# Patient Record
Sex: Female | Born: 1990
Health system: Southern US, Community
[De-identification: ages and names within clinical notes are randomized; demographics above are authoritative.]

## PROBLEM LIST (undated history)

## (undated) DIAGNOSIS — F32A Depression, unspecified: Secondary | ICD-10-CM

## (undated) HISTORY — PX: TUBAL LIGATION: SHX77

---

## 2020-06-17 ENCOUNTER — Ambulatory Visit: Admit: 2020-06-17 | Discharge status: Absent without leave | Payer: Self-pay

## 2020-06-17 ENCOUNTER — Ambulatory Visit
Admission: EM | Admit: 2020-06-17 | Discharge: 2020-06-17 | Disposition: A | Discharge status: Home / Self Care | Payer: Self-pay | Attending: Emergency Medicine | Admitting: Emergency Medicine

## 2020-06-17 DIAGNOSIS — R030 Elevated blood-pressure reading, without diagnosis of hypertension: Secondary | ICD-10-CM

## 2020-06-17 DIAGNOSIS — F439 Reaction to severe stress, unspecified: Secondary | ICD-10-CM

## 2020-06-17 DIAGNOSIS — Z566 Other physical and mental strain related to work: Secondary | ICD-10-CM

## 2020-06-17 MED ORDER — HYDROXYZINE HCL 25 MG PO TABS: 25.0000 mg | ORAL_TABLET | Freq: Four times a day (QID) | ORAL | 0 refills | Status: AC

## 2020-06-17 NOTE — Discharge Instructions (Addendum)
Practice stress management techniques as discussed. Important to schedule follow-up with primary care for further evaluation and management of blood pressure. In the interim, please go to the ER if you have worsening chest pain, difficulty breathing, lower leg swelling, weakness, nausea or vomiting, lightheadedness, dizziness, or you pass out.

## 2020-06-17 NOTE — ED Triage Notes (Signed)
Pt c/o high blood pressure for past 2 day with headaches yesterday. States while in waiting room she developed dizziness and center chest pressure. Pt states has been under a lot of stress recently. No hx of HTN.

## 2020-06-17 NOTE — ED Provider Notes (Signed)
[facility]    CSN: [id number] Arrival date & time: 06/17/20  0917      History   Chief Complaint Chief Complaint  Patient presents with  . Hypertension    HPI Jamie Moses is a 29 y.o. female presenting for evaluation of elevated blood pressure readings. 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 Has been having headaches and feeling significantly stressed over the last few days. Blood pressure readings were elevated with systolics of 150s, diastolic 100s. Does endorse some chest tightness, that is alleviated when she is home. Patient is also a single mother to 2 kids and attending school. States she "needs to prioritize things ". Denies SI/HI. Has never been on SSRI or blood pressure medications. No history of DVT, PE, pregnancy (s/p BTL). No weakness, numbness, nausea, vomiting, palpitations, shortness of breath.    History reviewed. No pertinent past medical history.  There are no problems to display for this patient.   Past Surgical History:  Procedure Laterality Date  . TUBAL LIGATION      OB History   No obstetric history on file.      Home Medications    Prior to Admission medications   Medication Sig Start Date End Date Taking? Authorizing Provider  hydrOXYzine (ATARAX/VISTARIL) 25 MG tablet Take 1 tablet (25 mg total) by mouth every 6 (six) hours. 06/17/20   Hall-Potvin, [place], PA-C    Family History History reviewed. No pertinent family history.  Social History Social History   Tobacco Use  . Smoking status: Never Smoker  . Smokeless tobacco: Never Used  Substance Use Topics  . Alcohol use: Not Currently  . Drug use: Not Currently     Allergies   Patient has no known allergies.   Review of Systems As per HPI   Physical Exam Triage Vital Signs ED Triage Vitals  Enc Vitals Group     BP 06/17/20 1013 (!) 153/98     Pulse Rate 06/17/20 1013 97     Resp 06/17/20 1013 18     Temp 06/17/20 1013 98 F (36.7 C)      Temp Source 06/17/20 1013 Oral     SpO2 06/17/20 1013 98 %     Weight --      Height --      Head Circumference --      Peak Flow --      Pain Score 06/17/20 1014 8     Pain Loc --      Pain Edu? --      Excl. in GC? --    No data found.  Updated Vital Signs BP (!) 142/87 (BP Location: Left Arm)   Pulse 97   Temp 98 F (36.7 C) (Oral)   Resp 18   LMP 06/03/2020   SpO2 98%   Visual Acuity Right Eye Distance:   Left Eye Distance:   Bilateral Distance:    Right Eye Near:   Left Eye Near:    Bilateral Near:     Physical Exam Vitals reviewed.  Constitutional:      General: She is not in acute distress.    Appearance: She is normal weight. She is not ill-appearing.  HENT:     Head: Normocephalic and atraumatic.  Eyes:     General: No scleral icterus.       Right eye: No discharge.        Left eye: No discharge.     Extraocular Movements: Extraocular movements intact.  Pupils: Pupils are equal, round, and reactive to light.  Cardiovascular:     Rate and Rhythm: Normal rate and regular rhythm.     Pulses: Normal pulses.     Heart sounds: No murmur heard.   Pulmonary:     Effort: Pulmonary effort is normal. No respiratory distress.     Breath sounds: No wheezing.  Chest:     Chest wall: No tenderness.  Abdominal:     General: Abdomen is flat.     Palpations: Abdomen is soft.     Tenderness: There is no abdominal tenderness. There is no guarding.  Musculoskeletal:     Cervical back: Normal range of motion and neck supple. No muscular tenderness.  Lymphadenopathy:     Cervical: No cervical adenopathy.  Skin:    General: Skin is warm.     Capillary Refill: Capillary refill takes less than 2 seconds.     Coloration: Skin is not jaundiced or pale.     Findings: No rash.  Neurological:     General: No focal deficit present.     Mental Status: She is alert and oriented to person, place, and time.  Psychiatric:        Mood and Affect: Mood normal.         Thought Content: Thought content normal.      UC Treatments / Results  Labs (all labs ordered are listed, but only abnormal results are displayed) Labs Reviewed - No data to display  EKG   Radiology No results found.  Procedures Procedures (including critical care time)  Medications Ordered in UC Medications - No data to display  Initial Impression / Assessment and Plan / UC Course  I have reviewed the triage vital signs and the nursing notes.  Pertinent labs & imaging results that were available during my care of the patient were reviewed by me and considered in my medical decision making (see chart for details).     Patient appears well today in office. With rest/relaxation blood pressure trending towards normal.  EKG done in office, reviewed by me without previous to compare: NSR with ventricular 86 bpm.  No QTC prolongation.  No ST elevation or depression.  Nonacute EKG.Unable to calculate heart score as there is no ability to draw troponin in UC setting.  Patient is low risk based on age, comorbidities. Offered trialing hydroxyzine for anxiety, amlodipine for blood pressure. Patient electing to trial hydroxyzine, wants to defer amlodipine at this time and lieu of stress management/mindfulness. Will reevaluate at PCP appointment.  ER return precautions discussed, pt verbalized understanding and is agreeable to plan. Final Clinical Impressions(s) / UC Diagnoses   Final diagnoses:  Elevated blood pressure reading in office without diagnosis of hypertension  Stress at home  Stress at work     Discharge Instructions     Practice stress management techniques as discussed. Important to schedule follow-up with primary care for further evaluation and management of blood pressure. In the interim, please go to the ER if you have worsening chest pain, difficulty breathing, lower leg swelling, weakness, nausea or vomiting, lightheadedness, dizziness, or you pass out.    ED  Prescriptions    Medication Sig Dispense Auth. Provider   hydrOXYzine (ATARAX/VISTARIL) 25 MG tablet Take 1 tablet (25 mg total) by mouth every 6 (six) hours. 12 tablet Hall-Potvin, [place], PA-C     PDMP not reviewed this encounter.   Hall-Potvin, [place], [place] 06/17/20 1127

## 2020-12-17 DIAGNOSIS — Z1151 Encounter for screening for human papillomavirus (HPV): Secondary | ICD-10-CM | POA: Diagnosis not present

## 2020-12-17 DIAGNOSIS — Z01419 Encounter for gynecological examination (general) (routine) without abnormal findings: Secondary | ICD-10-CM | POA: Diagnosis not present

## 2020-12-17 DIAGNOSIS — Z202 Contact with and (suspected) exposure to infections with a predominantly sexual mode of transmission: Secondary | ICD-10-CM | POA: Diagnosis not present

## 2020-12-17 DIAGNOSIS — F419 Anxiety disorder, unspecified: Secondary | ICD-10-CM | POA: Diagnosis not present

## 2020-12-17 DIAGNOSIS — Z124 Encounter for screening for malignant neoplasm of cervix: Secondary | ICD-10-CM | POA: Diagnosis not present

## 2020-12-17 DIAGNOSIS — R5383 Other fatigue: Secondary | ICD-10-CM | POA: Diagnosis not present

## 2020-12-17 DIAGNOSIS — Z130 Encounter for screening for diseases of the blood and blood-forming organs and certain disorders involving the immune mechanism: Secondary | ICD-10-CM | POA: Diagnosis not present

## 2021-02-16 DIAGNOSIS — F419 Anxiety disorder, unspecified: Secondary | ICD-10-CM | POA: Diagnosis not present

## 2021-02-16 DIAGNOSIS — F320 Major depressive disorder, single episode, mild: Secondary | ICD-10-CM | POA: Diagnosis not present

## 2021-02-16 DIAGNOSIS — I10 Essential (primary) hypertension: Secondary | ICD-10-CM | POA: Diagnosis not present

## 2021-03-19 DIAGNOSIS — F419 Anxiety disorder, unspecified: Secondary | ICD-10-CM | POA: Diagnosis not present

## 2021-03-19 DIAGNOSIS — I10 Essential (primary) hypertension: Secondary | ICD-10-CM | POA: Diagnosis not present

## 2021-03-19 DIAGNOSIS — F320 Major depressive disorder, single episode, mild: Secondary | ICD-10-CM | POA: Diagnosis not present

## 2021-04-30 DIAGNOSIS — F320 Major depressive disorder, single episode, mild: Secondary | ICD-10-CM | POA: Diagnosis not present

## 2021-04-30 DIAGNOSIS — I10 Essential (primary) hypertension: Secondary | ICD-10-CM | POA: Diagnosis not present

## 2021-04-30 DIAGNOSIS — F419 Anxiety disorder, unspecified: Secondary | ICD-10-CM | POA: Diagnosis not present

## 2021-05-13 ENCOUNTER — Other Ambulatory Visit (HOSPITAL_BASED_OUTPATIENT_CLINIC_OR_DEPARTMENT_OTHER): Payer: Self-pay | Admitting: Physician Assistant

## 2021-05-13 ENCOUNTER — Ambulatory Visit (HOSPITAL_BASED_OUTPATIENT_CLINIC_OR_DEPARTMENT_OTHER): Payer: BC Managed Care – PPO

## 2021-05-13 DIAGNOSIS — R1011 Right upper quadrant pain: Secondary | ICD-10-CM

## 2021-05-13 DIAGNOSIS — R198 Other specified symptoms and signs involving the digestive system and abdomen: Secondary | ICD-10-CM | POA: Diagnosis not present

## 2021-05-18 ENCOUNTER — Other Ambulatory Visit (HOSPITAL_COMMUNITY): Payer: Self-pay | Admitting: Physician Assistant

## 2021-05-18 ENCOUNTER — Other Ambulatory Visit: Payer: Self-pay | Admitting: Physician Assistant

## 2021-05-18 DIAGNOSIS — Z800 Family history of malignant neoplasm of digestive organs: Secondary | ICD-10-CM | POA: Diagnosis not present

## 2021-05-18 DIAGNOSIS — R112 Nausea with vomiting, unspecified: Secondary | ICD-10-CM

## 2021-05-18 DIAGNOSIS — R1013 Epigastric pain: Secondary | ICD-10-CM | POA: Diagnosis not present

## 2021-05-18 DIAGNOSIS — R1011 Right upper quadrant pain: Secondary | ICD-10-CM

## 2021-05-27 ENCOUNTER — Other Ambulatory Visit: Payer: Self-pay

## 2021-05-27 ENCOUNTER — Ambulatory Visit (HOSPITAL_COMMUNITY)
Admission: RE | Admit: 2021-05-27 | Discharge: 2021-05-27 | Disposition: A | Discharge status: Home / Self Care | Payer: BC Managed Care – PPO | Source: Ambulatory Visit | Attending: Physician Assistant | Admitting: Physician Assistant

## 2021-05-27 DIAGNOSIS — R112 Nausea with vomiting, unspecified: Secondary | ICD-10-CM | POA: Insufficient documentation

## 2021-05-27 DIAGNOSIS — R1011 Right upper quadrant pain: Secondary | ICD-10-CM | POA: Insufficient documentation

## 2021-05-27 MED ORDER — TECHNETIUM TC 99M MEBROFENIN IV KIT
5.0000 | PACK | Freq: Once | INTRAVENOUS | Status: AC | PRN
  Administered 2021-05-27: 5 via INTRAVENOUS

## 2021-06-02 DIAGNOSIS — R1011 Right upper quadrant pain: Secondary | ICD-10-CM | POA: Diagnosis not present

## 2021-06-07 ENCOUNTER — Other Ambulatory Visit: Payer: Self-pay

## 2021-06-07 ENCOUNTER — Emergency Department (HOSPITAL_BASED_OUTPATIENT_CLINIC_OR_DEPARTMENT_OTHER): Payer: BC Managed Care – PPO

## 2021-06-07 ENCOUNTER — Emergency Department (HOSPITAL_BASED_OUTPATIENT_CLINIC_OR_DEPARTMENT_OTHER)
Admission: EM | Admit: 2021-06-07 | Discharge: 2021-06-07 | Disposition: A | Discharge status: Home / Self Care | Payer: BC Managed Care – PPO | Attending: Emergency Medicine | Admitting: Emergency Medicine

## 2021-06-07 ENCOUNTER — Encounter (HOSPITAL_BASED_OUTPATIENT_CLINIC_OR_DEPARTMENT_OTHER): Payer: Self-pay

## 2021-06-07 DIAGNOSIS — R110 Nausea: Secondary | ICD-10-CM | POA: Diagnosis not present

## 2021-06-07 DIAGNOSIS — R1011 Right upper quadrant pain: Secondary | ICD-10-CM | POA: Diagnosis not present

## 2021-06-07 DIAGNOSIS — R1111 Vomiting without nausea: Secondary | ICD-10-CM | POA: Diagnosis not present

## 2021-06-07 DIAGNOSIS — N200 Calculus of kidney: Secondary | ICD-10-CM | POA: Diagnosis not present

## 2021-06-07 HISTORY — DX: Depression, unspecified: F32.A

## 2021-06-07 LAB — COMPREHENSIVE METABOLIC PANEL
ALT: 20 U/L (ref 0–44)
AST: 22 U/L (ref 15–41)
Albumin: 4.1 g/dL (ref 3.5–5.0)
Alkaline Phosphatase: 60 U/L (ref 38–126)
Anion gap: 8 (ref 5–15)
BUN: 7 mg/dL (ref 6–20)
CO2: 25 mmol/L (ref 22–32)
Calcium: 8.8 mg/dL — ABNORMAL LOW (ref 8.9–10.3)
Chloride: 101 mmol/L (ref 98–111)
Creatinine, Ser: 0.65 mg/dL (ref 0.44–1.00)
GFR, Estimated: >60 mL/min (ref >60)
Glucose, Bld: 94 mg/dL (ref 70–99)
Potassium: 3.8 mmol/L (ref 3.5–5.1)
Sodium: 134 mmol/L — ABNORMAL LOW (ref 135–145)
Total Bilirubin: 0.1 mg/dL — ABNORMAL LOW (ref 0.3–1.2)
Total Protein: 7.8 g/dL (ref 6.5–8.1)

## 2021-06-07 LAB — URINALYSIS, ROUTINE W REFLEX MICROSCOPIC
Bilirubin Urine: NEGATIVE
Glucose, UA: NEGATIVE mg/dL
Hgb urine dipstick: NEGATIVE
Ketones, ur: NEGATIVE mg/dL
Leukocytes,Ua: NEGATIVE
Nitrite: NEGATIVE
Protein, ur: NEGATIVE mg/dL
Specific Gravity, Urine: 1.02 (ref 1.005–1.030)
pH: 7 (ref 5.0–8.0)

## 2021-06-07 LAB — CBC
HCT: 34.9 % — ABNORMAL LOW (ref 36.0–46.0)
Hemoglobin: 11.1 g/dL — ABNORMAL LOW (ref 12.0–15.0)
MCH: 26.1 pg (ref 26.0–34.0)
MCHC: 31.8 g/dL (ref 30.0–36.0)
MCV: 82.1 fL (ref 80.0–100.0)
Platelets: 298 10*3/uL (ref 150–400)
RBC: 4.25 MIL/uL (ref 3.87–5.11)
RDW: 13.1 % (ref 11.5–15.5)
WBC: 6.8 10*3/uL (ref 4.0–10.5)
nRBC: 0 % (ref 0.0–0.2)

## 2021-06-07 LAB — LIPASE, BLOOD: Lipase: 21 U/L (ref 11–51)

## 2021-06-07 LAB — PREGNANCY, URINE: Preg Test, Ur: NEGATIVE

## 2021-06-07 MED ORDER — OXYCODONE-ACETAMINOPHEN 5-325 MG PO TABS
1.0000 | ORAL_TABLET | Freq: Once | ORAL | Status: AC
  Administered 2021-06-07: 1 via ORAL
  Filled 2021-06-07: qty 1

## 2021-06-07 NOTE — ED Provider Notes (Signed)
[facility] EMERGENCY DEPARTMENT Provider Note   CSN: [id number] Arrival date & time: 06/07/21  1711     History Chief Complaint  Patient presents with   Abdominal Pain    Jamie Moses is a 30 y.o. female.  30 y.o female with a PMH of recurrent RUQ pain presents to the ED with a chief complaint of right upper quadrant pain for the past month.  Reports she was previously seen by PCP, also seen by GI who wanted to assess for removal of her gallbladder along with worsening pain to the right upper quadrant.  She reports she has not had a CT performed yet.  Does endorse some constant right upper quadrant pain that has been worsening.  She has been taking Tylenol for pain without improvement in symptoms.  In addition, she tried Zofran but it did not help with her nausea.  She was referred to [facility] in the past in order to have her gallbladder removed, however no appointment has been established.  She was also told she would need a CT of her abdomen in order to rule out any colon cancer, as her mother has a history of this.  She denies any fever, vomiting, chest pain or shortness of breath.     Abdominal Pain Pain location:  RUQ Pain radiates to:  Does not radiate Pain severity:  Severe Onset quality:  Gradual Duration:  4 weeks Timing:  Constant Progression:  Worsening Chronicity:  Recurrent Relieved by:  Nothing Worsened by:  Nothing Ineffective treatments:  Acetaminophen and OTC medications Associated symptoms: nausea   Associated symptoms: no chest pain, no constipation, no diarrhea, no fever, no shortness of breath and no vomiting   Risk factors: obesity   Risk factors: no alcohol abuse, no aspirin use and has not had multiple surgeries       Past Medical History:  Diagnosis Date   Depression     There are no problems to display for this patient.   Past Surgical History:  Procedure Laterality Date   TUBAL LIGATION       OB History   No obstetric  history on file.     No family history on file.  Social History   Tobacco Use   Smoking status: Never   Smokeless tobacco: Never  Substance Use Topics   Alcohol use: Not Currently   Drug use: Not Currently    Home Medications Prior to Admission medications   Medication Sig Start Date End Date Taking? Authorizing Provider  hydrochlorothiazide (HYDRODIURIL) 12.5 MG tablet Take 12.5 mg by mouth every morning. 04/23/21   [provider]  hydrOXYzine (ATARAX/VISTARIL) 25 MG tablet Take 1 tablet (25 mg total) by mouth every 6 (six) hours. 06/17/20   Hall-Potvin, [place], PA-C  ondansetron (ZOFRAN-ODT) 4 MG disintegrating tablet Take 4 mg by mouth daily as needed. 05/14/21   [provider]  sertraline (ZOLOFT) 50 MG tablet Take 50 mg by mouth daily. 04/28/21   [provider]    Allergies    Patient has no known allergies.  Review of Systems   Review of Systems  Constitutional:  Negative for fever.  Respiratory:  Negative for shortness of breath.   Cardiovascular:  Negative for chest pain.  Gastrointestinal:  Positive for abdominal pain and nausea. Negative for constipation, diarrhea and vomiting.  Genitourinary:  Negative for flank pain.  All other systems reviewed and are negative.  Physical Exam Updated Vital Signs BP 122/74   Pulse 68  Temp 98.7 F (37.1 C) (Oral)   Resp 19   Ht 5' (1.524 m)   Wt 99.8 kg   LMP 05/27/2021   SpO2 100%   BMI 42.97 kg/m   Physical Exam Vitals and nursing note reviewed.  Constitutional:      General: She is not in acute distress.    Appearance: She is well-developed.     Comments: Appears uncomfortable.  HENT:     Head: Normocephalic and atraumatic.  Eyes:     Conjunctiva/sclera: Conjunctivae normal.  Cardiovascular:     Rate and Rhythm: Normal rate and regular rhythm.     Heart sounds: No murmur heard. Pulmonary:     Effort: Pulmonary effort is normal. No respiratory distress.     Breath sounds:  Normal breath sounds.  Abdominal:     Palpations: Abdomen is soft.     Tenderness: There is abdominal tenderness in the right upper quadrant. There is right CVA tenderness. Positive signs include Murphy's sign.     Comments: Significant pain along the right upper quadrant with radiation to her right shoulder.  Murphy sign present on my exam.  Bowel sounds are present throughout.  Musculoskeletal:     Cervical back: Neck supple.  Skin:    General: Skin is warm and dry.  Neurological:     Mental Status: She is alert.    ED Results / Procedures / Treatments   Labs (all labs ordered are listed, but only abnormal results are displayed) Labs Reviewed  COMPREHENSIVE METABOLIC PANEL - Abnormal; Notable for the following components:      Result Value   Sodium 134 (*)    Calcium 8.8 (*)    Total Bilirubin 0.1 (*)    All other components within normal limits  CBC - Abnormal; Notable for the following components:   Hemoglobin 11.1 (*)    HCT 34.9 (*)    All other components within normal limits  URINALYSIS, ROUTINE W REFLEX MICROSCOPIC - Abnormal; Notable for the following components:   APPearance HAZY (*)    All other components within normal limits  LIPASE, BLOOD  PREGNANCY, URINE    EKG None  Radiology CT ABDOMEN PELVIS WO CONTRAST  Result Date: 06/07/2021 CLINICAL DATA:  Nausea, vomiting, epigastric pain, abdominal pain, acute nonlocalized. EXAM: CT ABDOMEN AND PELVIS WITHOUT CONTRAST TECHNIQUE: Multidetector CT imaging of the abdomen and pelvis was performed following the standard protocol without IV contrast. COMPARISON:  None. FINDINGS: Lower chest: Calcified granuloma in the left lung base. No focal infiltration or consolidation. Hepatobiliary: No focal liver abnormality is seen. No gallstones, gallbladder wall thickening, or biliary dilatation. Pancreas: Unremarkable. No pancreatic ductal dilatation or surrounding inflammatory changes. Spleen: Normal in size without focal  abnormality. Adrenals/Urinary Tract: Single bilateral intrarenal stones, largest on the right measures 4 mm in diameter. No hydronephrosis or hydroureter and no ureteral stones are identified. Bladder is unremarkable. Stomach/Bowel: Stomach is within normal limits. Appendix appears normal. No evidence of bowel wall thickening, distention, or inflammatory changes. Vascular/Lymphatic: No significant vascular findings are present. No enlarged abdominal or pelvic lymph nodes. Reproductive: Uterus and bilateral adnexa are unremarkable. Other: No abdominal wall hernia or abnormality. No abdominopelvic ascites. Musculoskeletal: No acute or significant osseous findings. IMPRESSION: 1. Bilateral intrarenal nonobstructing stones. No ureteral stone or obstruction identified. 2. No evidence of bowel obstruction or inflammation. Appendix is normal. Electronically Signed   By: Burman Nieves M.D.   On: 06/07/2021 20:56    Procedures Procedures   Medications Ordered in  ED Medications  oxyCODONE-acetaminophen (PERCOCET/ROXICET) 5-325 MG per tablet 1 tablet (1 tablet Oral Given 06/07/21 2004)    ED Course  I have reviewed the triage vital signs and the nursing notes.  Pertinent labs & imaging results that were available during my care of the patient were reviewed by me and considered in my medical decision making (see chart for details).  Clinical Course as of 06/07/21 2244  Mon Jun 07, 2021  1946 Appearance(!): HAZY [JS]    Clinical Course User Index [JS] Claude Manges, [place]   MDM Rules/Calculators/A&P  Patient with a past medical history of recurrent right upper quadrant pain presents to the ED with worsening right upper quadrant pain that has been ongoing for the past month.  Previously evaluated by GI, who wanted a CT in order to assess for her gallbladder along with screening of colon cancer as her mom is a current survivor.  She has tried some Tylenol, Zofran without much improvement in  symptoms.  During arrival in the ED labs were obtained in triage, vital signs are within normal limits, she is afebrile nontoxic-appearing.  Abdomen is soft, tender to palpation along the right upper quadrant with radiation to her right shoulder, Murphy sign present on my exam.  Some right CVA tenderness but denies urinary symptoms on today's visit.  No chest pain, no shortness of breath and lungs are clear to auscultation without any swelling to her legs.  We discussed the results of her blood work on today's visit.  CMP with a slight decrease in her sodium.  Creatinine levels within normal limits.  LFTs are unremarkable, she does have a recurrent history of right upper quadrant pain, likely needs her gallbladder removed, however LFTs are unremarkable today and she is afebrile and tolerating p.o.  CBC with no leukocytosis.  Lipase levels normal.  Pregnancy test is also negative.  UA without any signs of infection.  She is requesting pain medication which I will provide for her along with CT abdomen and pelvis which was requested by gastroenterologist specialist.`  Provided with oxycodone during her ED visit, she refused any antiemetic at this time. CT Abdomen and pelvis showed: 1. Bilateral intrarenal nonobstructing stones. No ureteral stone or  obstruction identified.  2. No evidence of bowel obstruction or inflammation. Appendix is  normal.      She was provided with a copy of the CT result, I did discuss with her the importance of following up with [facility] in order to have her gallbladder removed to further avoid cholecystitis.  She is agreeable of treatment and plan at this time, return precautions discussed at length.  Patient stable for discharge.    Portions of this note were generated with Scientist, clinical (histocompatibility and immunogenetics). Dictation errors may occur despite best attempts at proofreading.  Final Clinical Impression(s) / ED Diagnoses Final diagnoses:  Right upper quadrant  abdominal pain    Rx / DC Orders ED Discharge Orders     None        Claude Manges, PA-C 06/07/21 2244    Vanetta Mulders, MD 06/09/21 2010

## 2021-06-07 NOTE — Discharge Instructions (Addendum)
You were provided with a copy of your CT scan on today's visit.  You will need to schedule an appointment with [facility] in order to further evaluate your recurrent right upper quadrant pain.

## 2021-06-07 NOTE — ED Triage Notes (Signed)
Pt c/o RUQ pain x 1 month. Saw GI who wants CT to assess gallbladder, states didn't follow up. States pain is worsening. Nausea & diarrhea.

## 2021-06-09 ENCOUNTER — Other Ambulatory Visit: Payer: Self-pay | Admitting: Surgery

## 2021-06-09 DIAGNOSIS — R1011 Right upper quadrant pain: Secondary | ICD-10-CM

## 2021-06-10 DIAGNOSIS — R1013 Epigastric pain: Secondary | ICD-10-CM | POA: Diagnosis not present

## 2021-06-10 DIAGNOSIS — K529 Noninfective gastroenteritis and colitis, unspecified: Secondary | ICD-10-CM | POA: Diagnosis not present

## 2021-06-29 DIAGNOSIS — Z800 Family history of malignant neoplasm of digestive organs: Secondary | ICD-10-CM | POA: Diagnosis not present

## 2021-06-29 DIAGNOSIS — R1011 Right upper quadrant pain: Secondary | ICD-10-CM | POA: Diagnosis not present

## 2021-06-29 DIAGNOSIS — R197 Diarrhea, unspecified: Secondary | ICD-10-CM | POA: Diagnosis not present

## 2021-06-29 DIAGNOSIS — R110 Nausea: Secondary | ICD-10-CM | POA: Diagnosis not present

## 2021-07-02 DIAGNOSIS — R1011 Right upper quadrant pain: Secondary | ICD-10-CM | POA: Diagnosis not present

## 2021-07-02 DIAGNOSIS — R197 Diarrhea, unspecified: Secondary | ICD-10-CM | POA: Diagnosis not present

## 2021-08-24 DIAGNOSIS — F330 Major depressive disorder, recurrent, mild: Secondary | ICD-10-CM | POA: Diagnosis not present

## 2021-09-10 DIAGNOSIS — F330 Major depressive disorder, recurrent, mild: Secondary | ICD-10-CM | POA: Diagnosis not present

## 2021-09-21 DIAGNOSIS — R197 Diarrhea, unspecified: Secondary | ICD-10-CM | POA: Diagnosis not present

## 2021-09-21 DIAGNOSIS — K3189 Other diseases of stomach and duodenum: Secondary | ICD-10-CM | POA: Diagnosis not present

## 2021-09-21 DIAGNOSIS — Z800 Family history of malignant neoplasm of digestive organs: Secondary | ICD-10-CM | POA: Diagnosis not present

## 2021-09-21 DIAGNOSIS — R1011 Right upper quadrant pain: Secondary | ICD-10-CM | POA: Diagnosis not present

## 2021-09-21 DIAGNOSIS — K319 Disease of stomach and duodenum, unspecified: Secondary | ICD-10-CM | POA: Diagnosis not present

## 2021-09-21 DIAGNOSIS — K2950 Unspecified chronic gastritis without bleeding: Secondary | ICD-10-CM | POA: Diagnosis not present

## 2021-09-21 DIAGNOSIS — Z1211 Encounter for screening for malignant neoplasm of colon: Secondary | ICD-10-CM | POA: Diagnosis not present

## 2021-09-21 DIAGNOSIS — R079 Chest pain, unspecified: Secondary | ICD-10-CM | POA: Diagnosis not present

## 2021-09-21 DIAGNOSIS — Z538 Procedure and treatment not carried out for other reasons: Secondary | ICD-10-CM | POA: Diagnosis not present

## 2021-09-21 DIAGNOSIS — K317 Polyp of stomach and duodenum: Secondary | ICD-10-CM | POA: Diagnosis not present

## 2021-09-21 DIAGNOSIS — Z91199 Patient's noncompliance with other medical treatment and regimen due to unspecified reason: Secondary | ICD-10-CM | POA: Diagnosis not present

## 2021-10-05 DIAGNOSIS — R112 Nausea with vomiting, unspecified: Secondary | ICD-10-CM | POA: Diagnosis not present

## 2021-10-05 DIAGNOSIS — Z1211 Encounter for screening for malignant neoplasm of colon: Secondary | ICD-10-CM | POA: Diagnosis not present

## 2021-10-05 DIAGNOSIS — Z800 Family history of malignant neoplasm of digestive organs: Secondary | ICD-10-CM | POA: Diagnosis not present

## 2021-10-05 DIAGNOSIS — R1011 Right upper quadrant pain: Secondary | ICD-10-CM | POA: Diagnosis not present

## 2021-10-08 DIAGNOSIS — F330 Major depressive disorder, recurrent, mild: Secondary | ICD-10-CM | POA: Diagnosis not present

## 2021-10-13 DIAGNOSIS — F330 Major depressive disorder, recurrent, mild: Secondary | ICD-10-CM | POA: Diagnosis not present

## 2022-07-12 IMAGING — CT CT ABD-PELV W/O CM
2 of 4 series · 16 of 46 positions shown, 18 images · non-contrast
Comparison: None.

CLINICAL DATA: Nausea, vomiting, epigastric pain, abdominal pain,
acute nonlocalized.

EXAM:
CT ABDOMEN AND PELVIS WITHOUT CONTRAST
TECHNIQUE: Multidetector CT imaging of the abdomen and pelvis was performed
following the standard protocol without IV contrast.

[Series 2: axial st · axial · 0.98mm/px · z∈[-722,-332]mm · 13 of 86 slices shown, 15 images]
[im 4/86  soft-tissue]
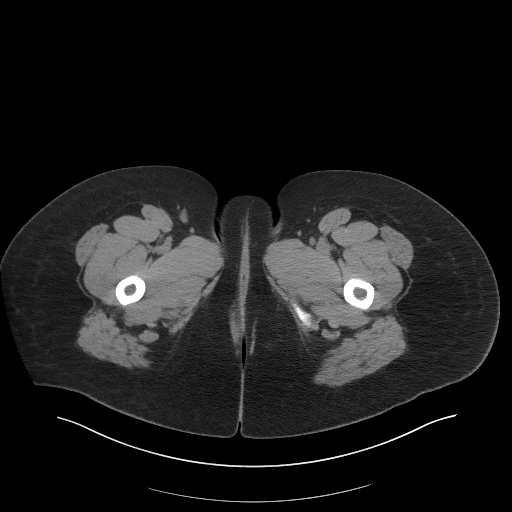
[im 4/86  bone]
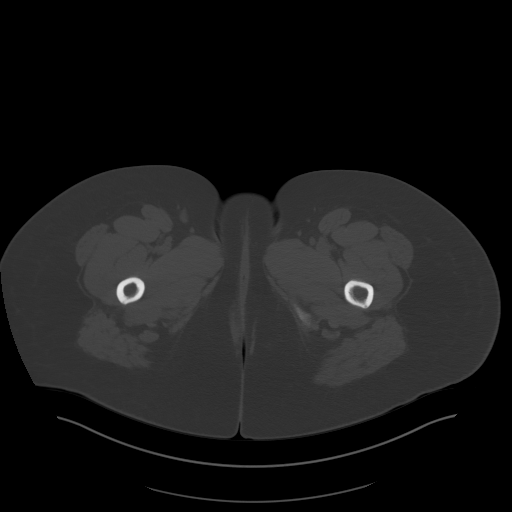
[im 12/86  soft-tissue]
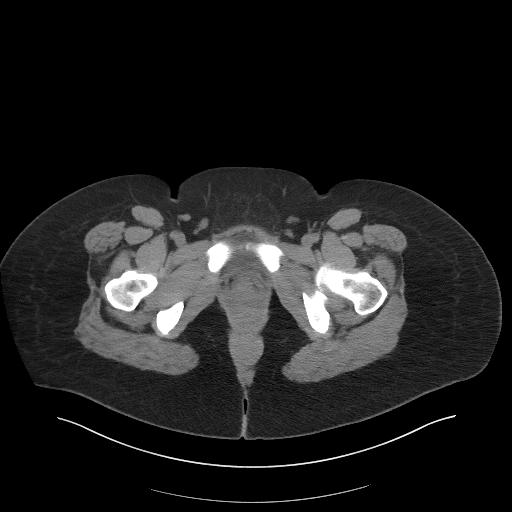
[im 19/86  soft-tissue]
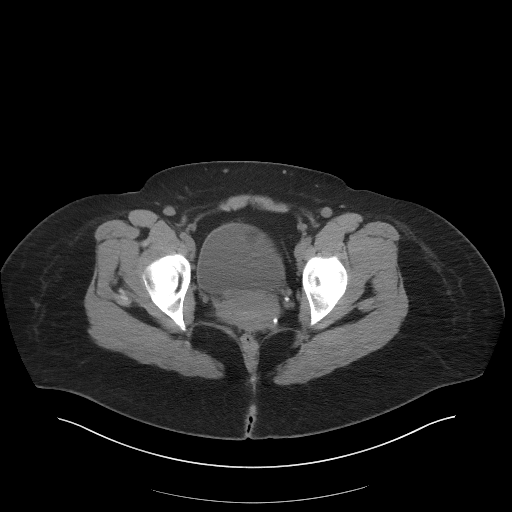
[im 23/86  soft-tissue]
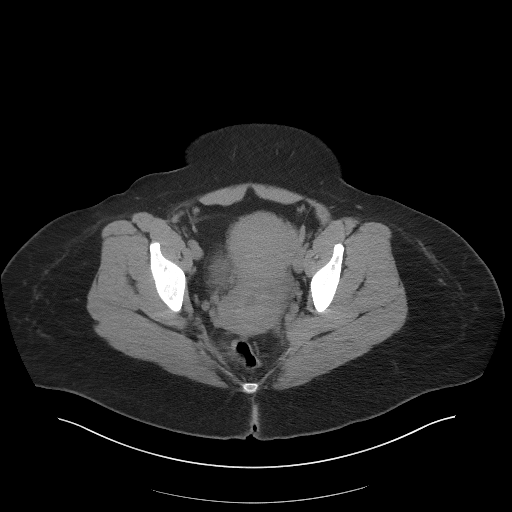
[im 30/86  soft-tissue]
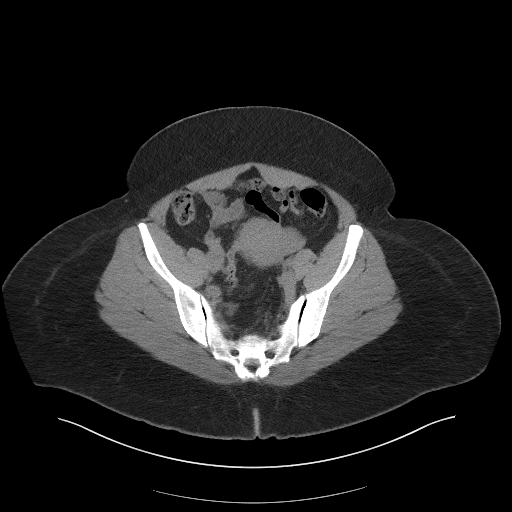
[im 37/86  soft-tissue]
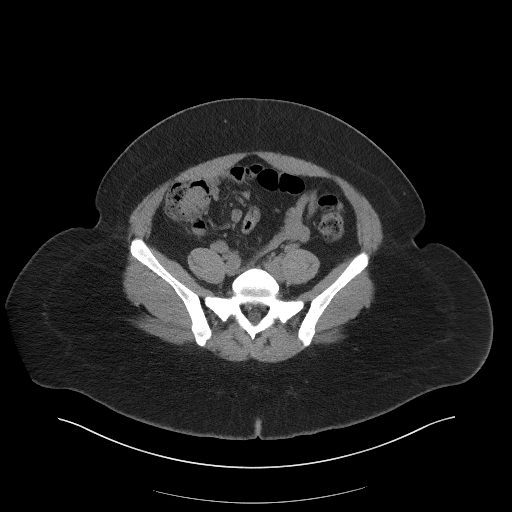
[im 45/86  soft-tissue]
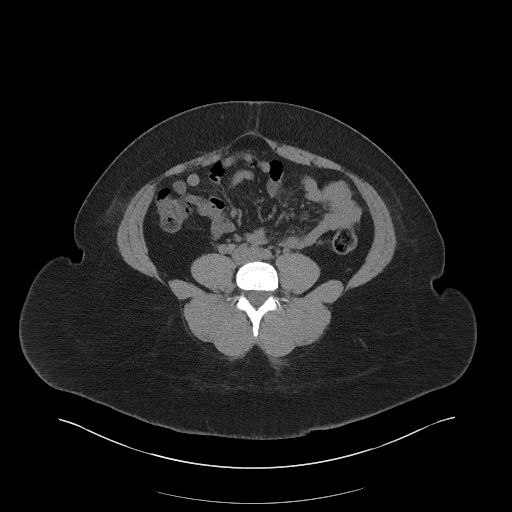
[im 49/86  soft-tissue]
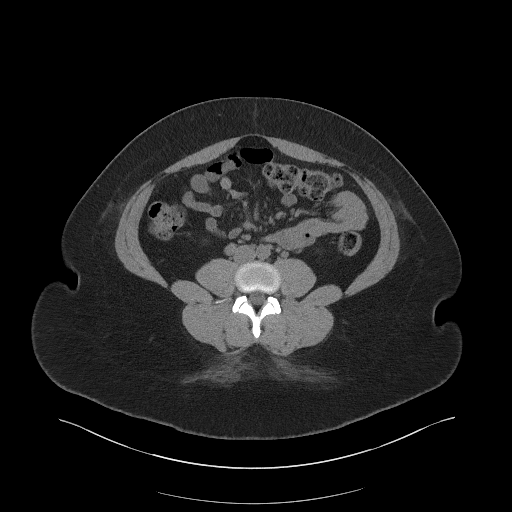
[im 56/86  soft-tissue]
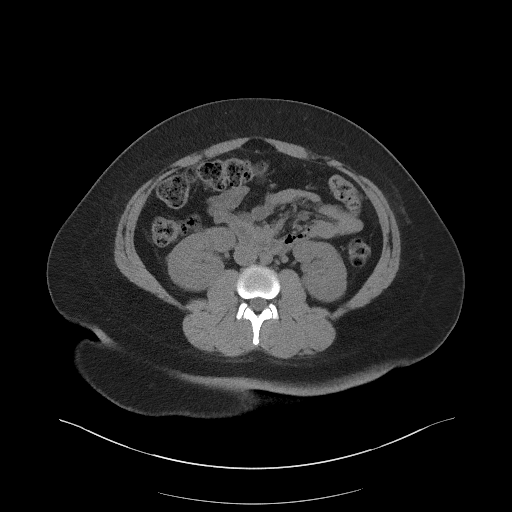
[im 56/86  bone]
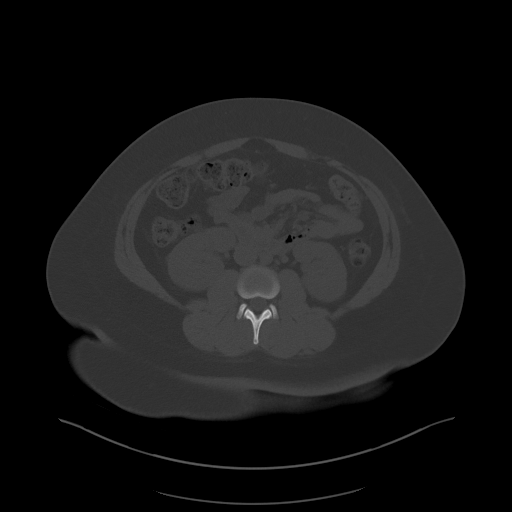
[im 63/86  soft-tissue]
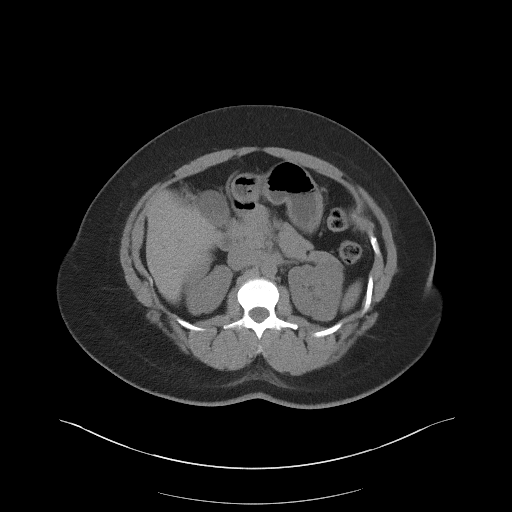
[im 67/86  soft-tissue]
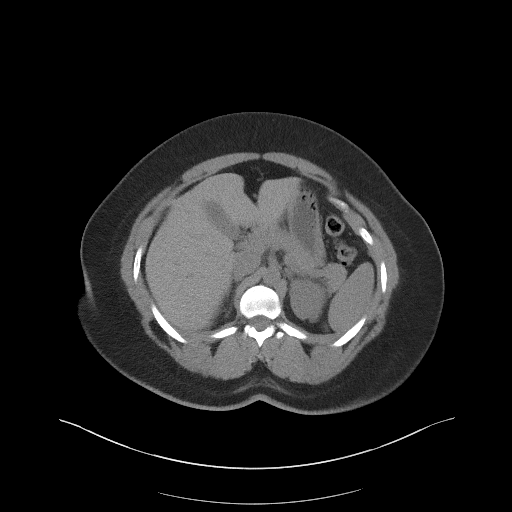
[im 74/86  soft-tissue]
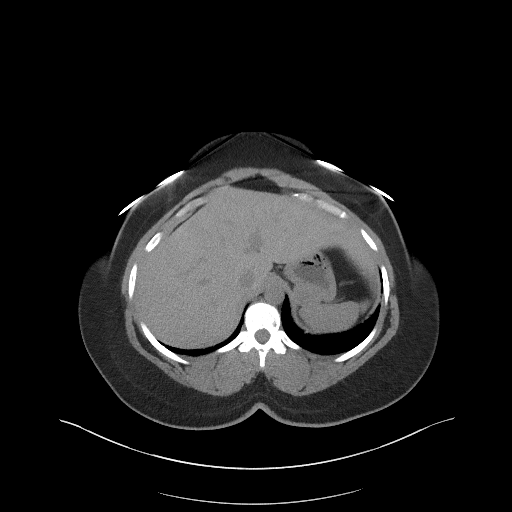
[im 82/86  soft-tissue]
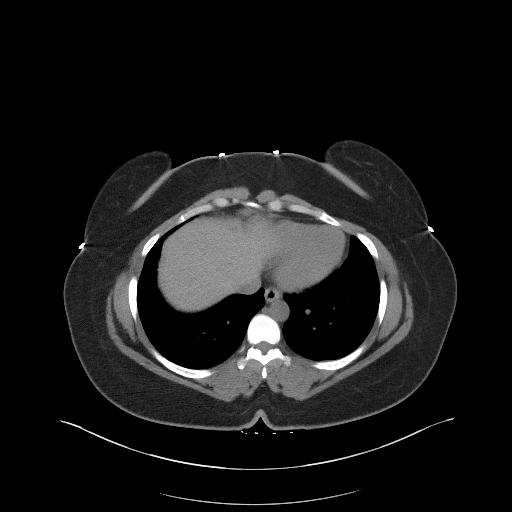

[Series 5: coronal st · coronal · 0.82mm/px · 3 of 123 slices shown]
[im 41/123  soft-tissue]
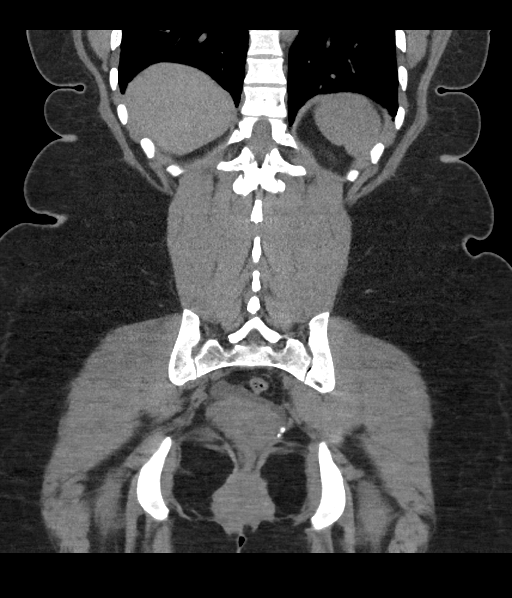
[im 55/123  soft-tissue]
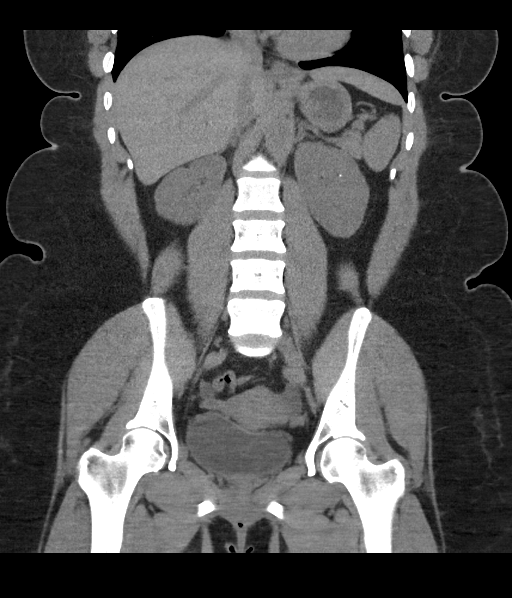
[im 68/123  soft-tissue]
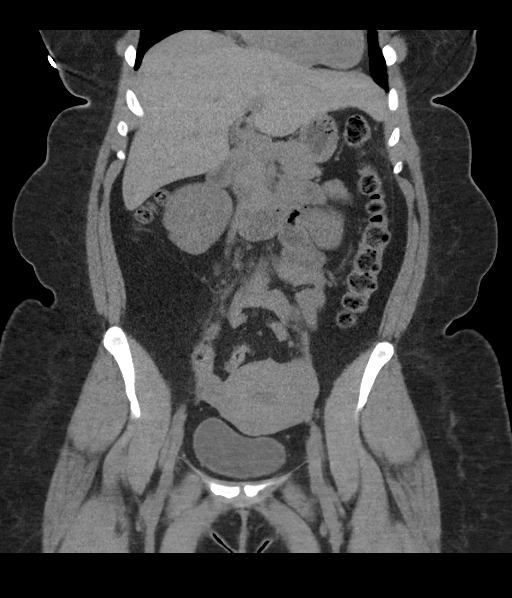

[16 of 46 positions shown; findings below may reference images not displayed]

FINDINGS: Lower chest: Calcified granuloma in the left lung base. No focal
infiltration or consolidation.

Hepatobiliary: No focal liver abnormality is seen. No gallstones,
gallbladder wall thickening, or biliary dilatation.

Pancreas: Unremarkable. No pancreatic ductal dilatation or
surrounding inflammatory changes.

Spleen: Normal in size without focal abnormality.

Adrenals/Urinary Tract: Single bilateral intrarenal stones, largest
on the right measures 4 mm in diameter. No hydronephrosis or
hydroureter and no ureteral stones are identified. Bladder is
unremarkable.

Stomach/Bowel: Stomach is within normal limits. Appendix appears
normal. No evidence of bowel wall thickening, distention, or
inflammatory changes.

Vascular/Lymphatic: No significant vascular findings are present. No
enlarged abdominal or pelvic lymph nodes.

Reproductive: Uterus and bilateral adnexa are unremarkable.

Other: No abdominal wall hernia or abnormality. No abdominopelvic
ascites.

Musculoskeletal: No acute or significant osseous findings.
IMPRESSION: 1. Bilateral intrarenal nonobstructing stones. No ureteral stone or
obstruction identified.
2. No evidence of bowel obstruction or inflammation. Appendix is
normal.

## 2022-10-26 ENCOUNTER — Emergency Department (HOSPITAL_BASED_OUTPATIENT_CLINIC_OR_DEPARTMENT_OTHER)
Admission: EM | Admit: 2022-10-26 | Discharge: 2022-10-26 | Disposition: A | Discharge status: Home / Self Care | Payer: BC Managed Care – PPO | Attending: Emergency Medicine | Admitting: Emergency Medicine

## 2022-10-26 ENCOUNTER — Other Ambulatory Visit: Payer: Self-pay

## 2022-10-26 ENCOUNTER — Encounter (HOSPITAL_BASED_OUTPATIENT_CLINIC_OR_DEPARTMENT_OTHER): Payer: Self-pay

## 2022-10-26 DIAGNOSIS — Z20822 Contact with and (suspected) exposure to covid-19: Secondary | ICD-10-CM | POA: Insufficient documentation

## 2022-10-26 DIAGNOSIS — G43809 Other migraine, not intractable, without status migrainosus: Secondary | ICD-10-CM | POA: Diagnosis not present

## 2022-10-26 DIAGNOSIS — R519 Headache, unspecified: Secondary | ICD-10-CM | POA: Diagnosis present

## 2022-10-26 LAB — RESP PANEL BY RT-PCR (RSV, FLU A&B, COVID)  RVPGX2
Influenza A by PCR: NEGATIVE
Influenza B by PCR: NEGATIVE
Resp Syncytial Virus by PCR: NEGATIVE
SARS Coronavirus 2 by RT PCR: NEGATIVE

## 2022-10-26 MED ORDER — KETOROLAC TROMETHAMINE 30 MG/ML IJ SOLN
30.0000 mg | Freq: Once | INTRAMUSCULAR | Status: AC
  Administered 2022-10-26: 30 mg via INTRAMUSCULAR
  Filled 2022-10-26: qty 1

## 2022-10-26 NOTE — ED Triage Notes (Signed)
Patient here POV from Home.  Endorses Headache and Aches that began this AM. Some associated Photosensitivity. No Known Fevers or Cough. No N/V/D.   NAD Noted during Triage. A&Ox4. GCS 15. Ambulatory.

## 2022-10-26 NOTE — Discharge Instructions (Signed)

## 2022-10-26 NOTE — ED Provider Notes (Signed)
Emergency Department Provider Note   I have reviewed the triage vital signs and the nursing notes.   HISTORY  Chief Complaint Headache   HPI Jamie Moses is a 32 y.o. female with past history reviewed below presents emergency department with headache along with some body aches.  She notes some mild photophobia.  Headache is gradual in onset and is throbbing in nature.  She notes the symptoms are mainly right-sided.  No numbness or weakness.  No speech changes.  No head injury.  She notes a prior history of migraine headache but has not had one in some time.  No nausea or vomiting. No other URI symptoms. No fever.    Past Medical History:  Diagnosis Date   Depression     Review of Systems  Constitutional: No fever/chills Eyes: Positive photophobia.  ENT: No sore throat. Cardiovascular: Denies chest pain. Respiratory: Denies shortness of breath. Gastrointestinal: No abdominal pain.  No nausea, no vomiting.   Musculoskeletal: Negative for back pain. Positive body aches.  Skin: Negative for rash. Neurological: Negative for focal weakness or numbness. Positive HA.    ____________________________________________   PHYSICAL EXAM:  VITAL SIGNS: ED Triage Vitals [10/26/22 2105]  Enc Vitals Group     BP (!) 152/113     Pulse Rate 78     Resp 18     Temp 97.9 F (36.6 C)     Temp Source Oral     SpO2 99 %     Weight 220 lb 0.3 oz (99.8 kg)     Height 5' (1.524 m)   Constitutional: Alert and oriented. Well appearing and in no acute distress. Eyes: Conjunctivae are normal. Mild photophobia.  Head: Atraumatic. Nose: No congestion/rhinnorhea. Mouth/Throat: Mucous membranes are moist.  Oropharynx non-erythematous. Neck: No stridor.  No meningeal signs. Cardiovascular: Normal rate, regular rhythm. Good peripheral circulation. Grossly normal heart sounds.   Respiratory: Normal respiratory effort.  No retractions. Lungs CTAB. Gastrointestinal: No distention.   Musculoskeletal: No lower extremity tenderness nor edema. No gross deformities of extremities. Neurologic:  Normal speech and language. No gross focal neurologic deficits are appreciated. 5/5 strength in the bilateral upper and lower extremities.  Skin:  Skin is warm, dry and intact. No rash noted.  ____________________________________________   LABS (all labs ordered are listed, but only abnormal results are displayed)  Labs Reviewed  RESP PANEL BY RT-PCR (RSV, FLU A&B, COVID)  [id number]   ____________________________________________   PROCEDURES  Procedure(s) performed:   Procedures  None  ____________________________________________   INITIAL IMPRESSION / ASSESSMENT AND PLAN / ED COURSE  Pertinent labs & imaging results that were available during my care of the patient were reviewed by me and considered in my medical decision making (see chart for details).   This patient is Presenting for Evaluation of HA, which does require a range of treatment options, and is a complaint that involves a moderate risk of morbidity and mortality.  The Differential Diagnoses includes but is not exclusive to subarachnoid hemorrhage, meningitis, encephalitis, previous head trauma, cavernous venous thrombosis, muscle tension headache, glaucoma, temporal arteritis, migraine or migraine equivalent, etc.   Critical Interventions-    Medications  ketorolac (TORADOL) 30 MG/ML injection 30 mg (30 mg Intramuscular Given 10/26/22 2349)    Reassessment after intervention: HA pain improving.     Clinical Laboratory Tests Ordered, included respiratory PCR negative.   Radiologic Tests: Considered head CT but patient without neuro deficits. No acute onset/max intensity HA symptoms. Defer imaging for now.  Medical Decision Making: Summary:  Patient presents to the emergency department with headache.  Symptoms seem most consistent with uncomplicated migraine.  No focal deficits or other red flag  signs/symptoms to prompt neuroimaging on an emergent basis.  Pain improving with Toradol here.  Reevaluation with update and discussion with patient.  Plan to give contact information to Neurology with prior history of migraine and return of migraine HA.   Patient's presentation is most consistent with acute illness / injury with system symptoms.   Disposition: discharge  ____________________________________________  FINAL CLINICAL IMPRESSION(S) / ED DIAGNOSES  Final diagnoses:  Other migraine without status migrainosus, not intractable    Note:  This document was prepared using Dragon voice recognition software and may include unintentional dictation errors.  Nanda Quinton, MD, [facility] Emergency Medicine    Sayge Brienza, [facility], MD 10/27/22 [phone]

## 2023-02-20 ENCOUNTER — Encounter (HOSPITAL_BASED_OUTPATIENT_CLINIC_OR_DEPARTMENT_OTHER): Payer: Self-pay | Admitting: Emergency Medicine

## 2023-02-20 ENCOUNTER — Other Ambulatory Visit: Payer: Self-pay

## 2023-02-20 ENCOUNTER — Emergency Department (HOSPITAL_BASED_OUTPATIENT_CLINIC_OR_DEPARTMENT_OTHER)
Admission: EM | Admit: 2023-02-20 | Discharge: 2023-02-20 | Disposition: A | Discharge status: Home / Self Care | Payer: BC Managed Care – PPO | Attending: Emergency Medicine | Admitting: Emergency Medicine

## 2023-02-20 DIAGNOSIS — I10 Essential (primary) hypertension: Secondary | ICD-10-CM | POA: Diagnosis not present

## 2023-02-20 DIAGNOSIS — Z79899 Other long term (current) drug therapy: Secondary | ICD-10-CM | POA: Insufficient documentation

## 2023-02-20 DIAGNOSIS — G43809 Other migraine, not intractable, without status migrainosus: Secondary | ICD-10-CM | POA: Diagnosis not present

## 2023-02-20 DIAGNOSIS — G43909 Migraine, unspecified, not intractable, without status migrainosus: Secondary | ICD-10-CM | POA: Diagnosis not present

## 2023-02-20 DIAGNOSIS — H53149 Visual discomfort, unspecified: Secondary | ICD-10-CM | POA: Diagnosis present

## 2023-02-20 LAB — BASIC METABOLIC PANEL
Anion gap: 8 (ref 5–15)
BUN: 6 mg/dL (ref 6–20)
CO2: 25 mmol/L (ref 22–32)
Calcium: 9.2 mg/dL (ref 8.9–10.3)
Chloride: 104 mmol/L (ref 98–111)
Creatinine, Ser: 0.77 mg/dL (ref 0.44–1.00)
GFR, Estimated: >60 mL/min (ref >60)
Glucose, Bld: 84 mg/dL (ref 70–99)
Potassium: 3.6 mmol/L (ref 3.5–5.1)
Sodium: 137 mmol/L (ref 135–145)

## 2023-02-20 LAB — CBC WITH DIFFERENTIAL/PLATELET
Abs Immature Granulocytes: 0.01 10*3/uL (ref 0.00–0.07)
Basophils Absolute: 0 10*3/uL (ref 0.0–0.1)
Basophils Relative: 1 %
Eosinophils Absolute: 0.2 10*3/uL (ref 0.0–0.5)
Eosinophils Relative: 3 %
HCT: 37.1 % (ref 36.0–46.0)
Hemoglobin: 11.5 g/dL — ABNORMAL LOW (ref 12.0–15.0)
Immature Granulocytes: 0 %
Lymphocytes Relative: 35 %
Lymphs Abs: 2.1 10*3/uL (ref 0.7–4.0)
MCH: 25.8 pg — ABNORMAL LOW (ref 26.0–34.0)
MCHC: 31 g/dL (ref 30.0–36.0)
MCV: 83.4 fL (ref 80.0–100.0)
Monocytes Absolute: 0.5 10*3/uL (ref 0.1–1.0)
Monocytes Relative: 7 %
Neutro Abs: 3.3 10*3/uL (ref 1.7–7.7)
Neutrophils Relative %: 54 %
Platelets: 285 10*3/uL (ref 150–400)
RBC: 4.45 MIL/uL (ref 3.87–5.11)
RDW: 13.2 % (ref 11.5–15.5)
WBC: 6.1 10*3/uL (ref 4.0–10.5)
nRBC: 0 % (ref 0.0–0.2)

## 2023-02-20 MED ORDER — KETOROLAC TROMETHAMINE 15 MG/ML IJ SOLN
30.0000 mg | Freq: Once | INTRAMUSCULAR | Status: AC
  Administered 2023-02-20: 30 mg via INTRAVENOUS
  Filled 2023-02-20: qty 2

## 2023-02-20 MED ORDER — SODIUM CHLORIDE 0.9 % IV BOLUS
500.0000 mL | Freq: Once | INTRAVENOUS | Status: AC
  Administered 2023-02-20: 500 mL via INTRAVENOUS

## 2023-02-20 NOTE — ED Triage Notes (Signed)
Pt arrives pov with c/o "migraine" with nausea and photo sensitivity x 3 days. Hx of same. Denies meds pta. Denies CP, pt aox4

## 2023-02-20 NOTE — ED Notes (Signed)
RN provided AVS using Teachback Method. Patient verbalizes understanding of Discharge Instructions. Opportunity for Questioning and Answers were provided by RN. Patient Discharged from ED ambulatory to Home via Self.  

## 2023-02-20 NOTE — ED Provider Notes (Signed)
[place] EMERGENCY DEPARTMENT AT [facility] Provider Note   CSN: [id number] Arrival date & time: 02/20/23  1632     History  Chief Complaint  Patient presents with   Migraine    Jamie Moses is a 32 y.o. female history of migraines presented with 3 days of right-sided migraine.  Patient states this feels like previous migraine she gets however does not see a primary care provider for prophylactic medication.  Patient states that she does have mild photophobia but denies any recent trauma, change in sensation/motor skills, fevers, sick contacts.  Patient tried Tylenol and ibuprofen to no relief.  Patient had mild nausea but no emesis and able to tolerate food and fluids orally.  Patient denies chest pain, shortness of breath, blood thinners, abdominal pain, dysuria, recent trauma, neck pain, new onset weakness, change in sensation/motor skills  Home Medications Prior to Admission medications   Medication Sig Start Date End Date Taking? Authorizing Provider  hydrochlorothiazide (HYDRODIURIL) 12.5 MG tablet Take 12.5 mg by mouth every morning. 04/23/21   [provider]  hydrOXYzine (ATARAX/VISTARIL) 25 MG tablet Take 1 tablet (25 mg total) by mouth every 6 (six) hours. 06/17/20   Hall-Potvin, [place], PA-C  ondansetron (ZOFRAN-ODT) 4 MG disintegrating tablet Take 4 mg by mouth daily as needed. 05/14/21   [provider]  sertraline (ZOLOFT) 50 MG tablet Take 50 mg by mouth daily. 04/28/21   [provider]      Allergies    Patient has no known allergies.    Review of Systems   Review of Systems See HPI Physical Exam Updated Vital Signs BP (!) 137/99   Pulse 94   Temp 98.6 F (37 C) (Oral)   Resp 18   Ht 5' (1.524 m)   Wt 97.5 kg   LMP 02/09/2023   SpO2 100%   BMI 41.99 kg/m  Physical Exam Constitutional:      General: She is not in acute distress. HENT:     Head: Normocephalic and atraumatic.  Eyes:     Extraocular Movements:  Extraocular movements intact.     Conjunctiva/sclera: Conjunctivae normal.     Pupils: Pupils are equal, round, and reactive to light.     Comments: Mild photophobia  Cardiovascular:     Rate and Rhythm: Normal rate and regular rhythm.     Pulses: Normal pulses.     Heart sounds: Normal heart sounds.  Pulmonary:     Effort: Pulmonary effort is normal. No respiratory distress.     Breath sounds: Normal breath sounds.  Abdominal:     Palpations: Abdomen is soft.     Tenderness: There is no abdominal tenderness. There is no guarding or rebound.  Musculoskeletal:        General: Normal range of motion.     Cervical back: Normal range of motion and neck supple. No rigidity or tenderness.  Skin:    General: Skin is warm and dry.     Capillary Refill: Capillary refill takes less than 2 seconds.  Neurological:     Mental Status: She is alert.     Comments: Sensation intact in all 4 limbs Cranial nerves III through XII intact Vision grossly intact     ED Results / Procedures / Treatments   Labs (all labs ordered are listed, but only abnormal results are displayed) Labs Reviewed  CBC WITH DIFFERENTIAL/PLATELET - Abnormal; Notable for the following components:      Result Value   Hemoglobin 11.5 (*)  MCH 25.8 (*)    All other components within normal limits  BASIC METABOLIC PANEL    EKG None  Radiology No results found.  Procedures Procedures    Medications Ordered in ED Medications  ketorolac (TORADOL) 15 MG/ML injection 30 mg (30 mg Intravenous Given 02/20/23 1734)  sodium chloride 0.9 % bolus 500 mL (500 mLs Intravenous New Bag/Given 02/20/23 1759)    ED Course/ Medical Decision Making/ A&P                             Medical Decision Making  Jearl Havard 32 y.o. presented today for headache. Working DDx that I considered at this time includes, but not limited to, tension headache, migraine intracranial mass, intracranial hemorrhage, intracranial infection  including meningitis vs encephalitis, GCA, trigeminal neuralgia.  R/o DDx: Migraine: These are considered less likely due to history of present illness and physical exam findings SAH/ICH: Timeline and slow onset is not consistent with SAH/ICH  GCA: Age and description of pain is not consistent with GCA  Meningitis/encephalitis: Lack of fever,meningismus is not consistent Intracranial Mass:  less likely due to history of present illness and physical exam findings Trigeminal Neuralgia: headache does not meet this description Intracranial Infection:  less likely due to history of present illness and physical exam findings, no fevers  Review of prior external notes: 10/26/2022 ED  Unique Tests and My Interpretation:  CBC: unremarkable BMP: Unremarkable  Discussion with Independent Historian: None  Discussion of Management of Tests: None  Risk: Medium: prescription drug management  Risk Stratification Score: None  Plan: Patient presented for HA. On exam patient was in no acute distress and stable vitals. Physical exam was unremarkable along with patient's neuroexam.  Patient stated this feels like previous migraines and has reassuring history because low suspicion for life-threatening diagnosis.  Patient has history of tubal removal with LMP 02/09/2023 and no kidney history or diabetic history and will be given Toradol for HA treatment as she has received this in the past for previous episodes along with 500 mL of fluids. Patient stable at this time.  On recheck patient stated that her symptoms have resolved after treatment.  Patient's labs are reassuring.  I encouraged patient to follow-up with her primary care provider regarding recent symptoms and ER visit and to be put possibly on prophylactic migraine medication as this is the second episode she has had in the last 6 months.  Patient verbalized agreement to this plan.  Patient was given return precautions. Patient stable for discharge at  this time.  Patient verbalized understanding of plan.         Final Clinical Impression(s) / ED Diagnoses Final diagnoses:  Other migraine without status migrainosus, not intractable    Rx / DC Orders ED Discharge Orders     None         Remi Deter 02/20/23 1814    Linwood Dibbles, MD 02/20/23 2328

## 2023-02-20 NOTE — Discharge Instructions (Signed)
Please follow-up with your primary care provider regarding recent symptoms and ER visit.  Today your blood labs were reassuring and your symptoms improved after treatment.  If symptoms worsen please return to ER.

## 2024-07-25 ENCOUNTER — Encounter (INDEPENDENT_AMBULATORY_CARE_PROVIDER_SITE_OTHER): Payer: Self-pay

## 2024-09-16 ENCOUNTER — Emergency Department (HOSPITAL_BASED_OUTPATIENT_CLINIC_OR_DEPARTMENT_OTHER)

## 2024-09-16 ENCOUNTER — Other Ambulatory Visit: Payer: Self-pay

## 2024-09-16 ENCOUNTER — Encounter (HOSPITAL_BASED_OUTPATIENT_CLINIC_OR_DEPARTMENT_OTHER): Payer: Self-pay

## 2024-09-16 ENCOUNTER — Emergency Department (HOSPITAL_BASED_OUTPATIENT_CLINIC_OR_DEPARTMENT_OTHER)
Admission: EM | Admit: 2024-09-16 | Discharge: 2024-09-16 | Disposition: A | Discharge status: Home / Self Care | Attending: Emergency Medicine | Admitting: Emergency Medicine

## 2024-09-16 DIAGNOSIS — R110 Nausea: Secondary | ICD-10-CM | POA: Diagnosis not present

## 2024-09-16 DIAGNOSIS — R1033 Periumbilical pain: Secondary | ICD-10-CM | POA: Diagnosis not present

## 2024-09-16 DIAGNOSIS — R1030 Lower abdominal pain, unspecified: Secondary | ICD-10-CM

## 2024-09-16 DIAGNOSIS — R1031 Right lower quadrant pain: Secondary | ICD-10-CM | POA: Diagnosis present

## 2024-09-16 DIAGNOSIS — R1011 Right upper quadrant pain: Secondary | ICD-10-CM | POA: Diagnosis not present

## 2024-09-16 LAB — CBC
HCT: 39 % (ref 36.0–46.0)
Hemoglobin: 12.4 g/dL (ref 12.0–15.0)
MCH: 26 pg (ref 26.0–34.0)
MCHC: 31.8 g/dL (ref 30.0–36.0)
MCV: 81.8 fL (ref 80.0–100.0)
Platelets: 364 K/uL (ref 150–400)
RBC: 4.77 MIL/uL (ref 3.87–5.11)
RDW: 12.9 % (ref 11.5–15.5)
WBC: 5.8 K/uL (ref 4.0–10.5)
nRBC: 0 % (ref 0.0–0.2)

## 2024-09-16 LAB — COMPREHENSIVE METABOLIC PANEL WITH GFR
ALT: 26 U/L (ref 0–44)
AST: 22 U/L (ref 15–41)
Albumin: 4.7 g/dL (ref 3.5–5.0)
Alkaline Phosphatase: 84 U/L (ref 38–126)
Anion gap: 11 (ref 5–15)
BUN: <5 mg/dL — ABNORMAL LOW (ref 6–20)
CO2: 23 mmol/L (ref 22–32)
Calcium: 9.9 mg/dL (ref 8.9–10.3)
Chloride: 103 mmol/L (ref 98–111)
Creatinine, Ser: 0.81 mg/dL (ref 0.44–1.00)
GFR, Estimated: >60 mL/min (ref >60)
Glucose, Bld: 94 mg/dL (ref 70–99)
Potassium: 3.8 mmol/L (ref 3.5–5.1)
Sodium: 137 mmol/L (ref 135–145)
Total Bilirubin: 0.4 mg/dL (ref 0.0–1.2)
Total Protein: 8.1 g/dL (ref 6.5–8.1)

## 2024-09-16 LAB — URINALYSIS, ROUTINE W REFLEX MICROSCOPIC
Bacteria, UA: NONE SEEN
Bilirubin Urine: NEGATIVE
Glucose, UA: NEGATIVE mg/dL
Ketones, ur: 15 mg/dL — AB
Leukocytes,Ua: NEGATIVE
Nitrite: NEGATIVE
Protein, ur: NEGATIVE mg/dL
RBC / HPF: >50 RBC/hpf (ref 0–5)
Specific Gravity, Urine: >1.046 — ABNORMAL HIGH (ref 1.005–1.030)
pH: 6.5 (ref 5.0–8.0)

## 2024-09-16 LAB — LIPASE, BLOOD: Lipase: 17 U/L (ref 11–51)

## 2024-09-16 LAB — HCG, SERUM, QUALITATIVE: Preg, Serum: NEGATIVE

## 2024-09-16 MED ORDER — SODIUM CHLORIDE 0.9 % IV BOLUS
1000.0000 mL | Freq: Once | INTRAVENOUS | Status: AC
  Administered 2024-09-16: 1000 mL via INTRAVENOUS

## 2024-09-16 MED ORDER — ONDANSETRON 4 MG PO TBDP: 4.0000 mg | ORAL_TABLET | Freq: Three times a day (TID) | ORAL | 0 refills | Status: AC | PRN

## 2024-09-16 MED ORDER — MORPHINE SULFATE (PF) 4 MG/ML IV SOLN
4.0000 mg | Freq: Once | INTRAVENOUS | Status: AC
  Administered 2024-09-16: 4 mg via INTRAVENOUS
  Filled 2024-09-16: qty 1

## 2024-09-16 MED ORDER — IOHEXOL 300 MG/ML  SOLN
100.0000 mL | Freq: Once | INTRAMUSCULAR | Status: AC | PRN
  Administered 2024-09-16: 100 mL via INTRAVENOUS

## 2024-09-16 MED ORDER — MELOXICAM 7.5 MG PO TABS: 7.5000 mg | ORAL_TABLET | Freq: Every day | ORAL | 0 refills | Status: AC

## 2024-09-16 MED ORDER — ONDANSETRON HCL 4 MG/2ML IJ SOLN
4.0000 mg | Freq: Once | INTRAMUSCULAR | Status: AC
  Administered 2024-09-16: 4 mg via INTRAVENOUS
  Filled 2024-09-16: qty 2

## 2024-09-16 MED ORDER — KETOROLAC TROMETHAMINE 15 MG/ML IJ SOLN
15.0000 mg | Freq: Once | INTRAMUSCULAR | Status: AC
  Administered 2024-09-16: 15 mg via INTRAVENOUS
  Filled 2024-09-16: qty 1

## 2024-09-16 MED ORDER — DICYCLOMINE HCL 20 MG PO TABS: 20.0000 mg | ORAL_TABLET | Freq: Two times a day (BID) | ORAL | 0 refills | Status: AC

## 2024-09-16 NOTE — ED Provider Notes (Signed)
 [place] EMERGENCY DEPARTMENT AT [facility] Provider Note   CSN: [id number] Arrival date & time: 09/16/24  1553     Patient presents with: Abdominal Pain   Jamie Moses is a 33 y.o. female here for evaluation of right sided abdominal pain.  Patient states she started her menstrual cycle yesterday.  Started developing intense period cramps.  Started suprapubic region radiates to right lower quadrant and up into her right abdomen.  Associated nausea without vomiting.  No fever, dysuria, hematuria.  Felt a more constipated, passing flatus.  No bloody stool.  Has a history of ovarian cyst.  Not able to get in with PCP who recommended come here for evaluation.  Still has gallbladder, appendix, ovaries and uterus.  Denies concern for pregnancy, STI.   HPI     Prior to Admission medications   Medication Sig Start Date End Date Taking? Authorizing Provider  dicyclomine (BENTYL) 20 MG tablet Take 1 tablet (20 mg total) by mouth 2 (two) times daily. 09/16/24  Yes Kentrel Clevenger A, PA-C  meloxicam (MOBIC) 7.5 MG tablet Take 1 tablet (7.5 mg total) by mouth daily. 09/16/24  Yes Jesseka Drinkard A, PA-C  ondansetron (ZOFRAN-ODT) 4 MG disintegrating tablet Take 1 tablet (4 mg total) by mouth every 8 (eight) hours as needed. 09/16/24  Yes Khadeeja Elden A, PA-C  hydrochlorothiazide (HYDRODIURIL) 12.5 MG tablet Take 12.5 mg by mouth every morning. 04/23/21   [provider]  hydrOXYzine  (ATARAX /VISTARIL ) 25 MG tablet Take 1 tablet (25 mg total) by mouth every 6 (six) hours. 06/17/20   Hall-Potvin, [place], PA-C  sertraline (ZOLOFT) 50 MG tablet Take 50 mg by mouth daily. 04/28/21   [provider]    Allergies: Patient has no known allergies.    Review of Systems  Constitutional: Negative.   HENT: Negative.    Respiratory: Negative.    Cardiovascular: Negative.   Gastrointestinal:  Positive for abdominal pain and nausea. Negative for vomiting.  Genitourinary:   Positive for decreased urine volume, menstrual problem, pelvic pain and vaginal bleeding. Negative for difficulty urinating, dysuria, flank pain, frequency, urgency and vaginal discharge.  Musculoskeletal: Negative.   Skin: Negative.   Neurological: Negative.   All other systems reviewed and are negative.   Updated Vital Signs BP 118/81   Pulse 87   Temp 97.8 F (36.6 C)   Resp 15   SpO2 96%   Physical Exam Vitals and nursing note reviewed.  Constitutional:      General: She is not in acute distress.    Appearance: She is well-developed. She is not ill-appearing, toxic-appearing or diaphoretic.  HENT:     Head: Atraumatic.  Eyes:     Pupils: Pupils are equal, round, and reactive to light.  Cardiovascular:     Rate and Rhythm: Normal rate and regular rhythm.     Heart sounds: Normal heart sounds.  Pulmonary:     Effort: Pulmonary effort is normal. No respiratory distress.     Breath sounds: Normal breath sounds.  Abdominal:     General: Bowel sounds are normal. There is no distension.     Palpations: Abdomen is soft.     Tenderness: There is abdominal tenderness in the right upper quadrant, right lower quadrant, periumbilical area and suprapubic area. There is no right CVA tenderness, left CVA tenderness, guarding or rebound.  Musculoskeletal:        General: Normal range of motion.     Cervical back: Normal range of motion.  Skin:  General: Skin is warm and dry.  Neurological:     General: No focal deficit present.     Mental Status: She is alert.  Psychiatric:        Mood and Affect: Mood normal.     (all labs ordered are listed, but only abnormal results are displayed) Labs Reviewed  COMPREHENSIVE METABOLIC PANEL WITH GFR - Abnormal; Notable for the following components:      Result Value   BUN <5 (*)    All other components within normal limits  URINALYSIS, ROUTINE W REFLEX MICROSCOPIC - Abnormal; Notable for the following components:   Color, Urine  COLORLESS (*)    Specific Gravity, Urine >1.046 (*)    Hgb urine dipstick LARGE (*)    Ketones, ur 15 (*)    All other components within normal limits  LIPASE, BLOOD  CBC  HCG, SERUM, QUALITATIVE    EKG: None  Radiology: CT ABDOMEN PELVIS W CONTRAST Result Date: 09/16/2024 EXAM: CT ABDOMEN AND PELVIS WITH CONTRAST 09/16/2024 08:49:41 PM TECHNIQUE: CT of the abdomen and pelvis was performed with the administration of 100 mL of iohexol (OMNIPAQUE) 300 MG/ML solution. Multiplanar reformatted images are provided for review. Automated exposure control, iterative reconstruction, and/or weight-based adjustment of the mA/kV was utilized to reduce the radiation dose to as low as reasonably achievable. COMPARISON: Comparison with 06/07/2021. CLINICAL HISTORY: RLQ abdominal pain. FINDINGS: LOWER CHEST: No acute abnormality. LIVER: The liver is unremarkable. GALLBLADDER AND BILE DUCTS: Gallbladder is unremarkable. No biliary ductal dilatation. SPLEEN: No acute abnormality. PANCREAS: No acute abnormality. ADRENAL GLANDS: No acute abnormality. KIDNEYS, URETERS AND BLADDER: Punctate nonobstructing left nephrolithiasis. No stones in the right kidney or ureters. No hydronephrosis. No perinephric or periureteral stranding. Urinary bladder is unremarkable. GI AND BOWEL: Stomach demonstrates no acute abnormality. There is no bowel obstruction. Normal appendix. PERITONEUM AND RETROPERITONEUM: No ascites. No free air. VASCULATURE: Aorta is normal in caliber. LYMPH NODES: No lymphadenopathy. REPRODUCTIVE ORGANS: No acute abnormality. BONES AND SOFT TISSUES: No acute osseous abnormality. No focal soft tissue abnormality. IMPRESSION: 1. No acute findings in the abdomen or pelvis. 2. Punctate nonobstructing left nephrolithiasis. Electronically signed by: Norman Gatlin MD 09/16/2024 09:28 PM EST RP Workstation: [id number]   US  PELVIC COMPLETE W TRANSVAGINAL AND TORSION R/O Result Date: 09/16/2024 CLINICAL DATA:  Right-sided  abdominal pain EXAM: TRANSABDOMINAL AND TRANSVAGINAL ULTRASOUND OF PELVIS DOPPLER ULTRASOUND OF OVARIES TECHNIQUE: Both transabdominal and transvaginal ultrasound examinations of the pelvis were performed. Transabdominal technique was performed for global imaging of the pelvis including uterus, ovaries, adnexal regions, and pelvic cul-de-sac. It was necessary to proceed with endovaginal exam following the transabdominal exam to visualize the ovaries and endometrium. Color and duplex Doppler ultrasound was utilized to evaluate blood flow to the ovaries. COMPARISON:  None Available. FINDINGS: Uterus Measurements: 11.0 x 6.0 x 5.6 cm = volume: 192 mL. No fibroids or other mass visualized. Endometrium Thickness: 6.8 mm.  No focal abnormality visualized. Right ovary Measurements: 4.3 x 1.6 x 3.0 cm = volume: 10.80 mL. Normal appearance/no adnexal mass. Left ovary Measurements: 3.0 x 1.9 x 2.6 cm = volume: 7.8 mL. Normal appearance/no adnexal mass. Pulsed Doppler evaluation of both ovaries demonstrates normal low-resistance arterial and venous waveforms. Other findings There is trace free fluid in the pelvis which is likely physiologic. IMPRESSION: No significant sonographic abnormality is seen in the pelvis. There is no evidence of ovarian torsion. Electronically Signed   By: Greig Pique M.D.   On: 09/16/2024 20:53  Procedures   Medications Ordered in the ED  ondansetron [facility]) injection 4 mg (4 mg Intravenous Given 09/16/24 1802)  sodium chloride  0.9 % bolus 1,000 mL (0 mLs Intravenous Stopped 09/16/24 1921)  morphine (PF) 4 MG/ML injection 4 mg (4 mg Intravenous Given 09/16/24 1802)  iohexol (OMNIPAQUE) 300 MG/ML solution 100 mL (100 mLs Intravenous Contrast Given 09/16/24 2044)  ketorolac  (TORADOL ) 15 MG/ML injection 15 mg (15 mg Intravenous Given 09/16/24 5351)   33 year old here for evaluation abdominal pain.  Started yesterday as well as her menstrual cycle starting yesterday as well.  Has a history  of ovarian cyst however pain today feels different than her typical menstrual pain.  Associated nausea.  Decrease in urine output, feels constipated.  Here she is afebrile, not septic appearing.  Has diffuse tenderness to her mid and right abdomen.  Will plan on labs, imaging, reassess  Labs and imaging personally viewed interpreted:  CBC without leukocytosis Metabolic panel without significant abnormality Lipase 17 Pregnancy test neg Urinalysis neg for infection, does show blood which is consistent with her menstrual cycle US  pelvis without significant abnormality CT AP wo acute abnormality  Patient reassessed.  Discussed labs and imaging.  Pain controlled, tolerating p.o. intake.  Will have her follow-up outpatient, return with new or worsening symptoms.  Patient is nontoxic, nonseptic appearing, in no apparent distress.  Patient's pain and other symptoms adequately managed in emergency department.  Fluid bolus given.  Labs, imaging and vitals reviewed.  Patient does not meet the SIRS or Sepsis criteria.  On repeat exam patient does not have a surgical abdomin and there are no peritoneal signs.  No indication of appendicitis, bowel obstruction, bowel perforation, cholecystitis, diverticulitis, PID, intermittent, persistent torsion, TOA, ectopic pregnancy, AAA, dissection, traumatic injury.  Patient discharged home with symptomatic treatment and given strict instructions for follow-up with their primary care physician.  I have also discussed reasons to return immediately to the ER.  Patient expresses understanding and agrees with plan.                                    Medical Decision Making Amount and/or Complexity of Data Reviewed External Data Reviewed: labs and radiology. Labs: ordered. Decision-making details documented in ED Course. Radiology: ordered and independent interpretation performed. Decision-making details documented in ED Course.  Risk OTC drugs. Prescription drug  management. Decision regarding hospitalization. Diagnosis or treatment significantly limited by social determinants of health.        Final diagnoses:  Lower abdominal pain  Nausea    ED Discharge Orders          Ordered    meloxicam (MOBIC) 7.5 MG tablet  Daily        09/16/24 2221    ondansetron (ZOFRAN-ODT) 4 MG disintegrating tablet  Every 8 hours PRN        09/16/24 2221    dicyclomine (BENTYL) 20 MG tablet  2 times daily        09/16/24 2221               Deandre Stansel A, PA-C 09/16/24 2317    Cottie Donnice PARAS, MD 09/19/24 1300

## 2024-09-16 NOTE — ED Triage Notes (Signed)
 Pt c/o intense period cramps, radiates to R side since yesterday. Menstrual cycle since yesterday, reports typical bleeding but the cramping & radiation are not typical for my cycle. Associated nausea Hx of cysts that collapse, advises that PCP was concerned for ovarian cyst rupture

## 2024-09-16 NOTE — ED Notes (Signed)
 Reviewed AVS/discharge instruction with patient. Time allotted for and all questions answered. Patient is agreeable for d/c and escorted to ed exit by staff.

## 2024-09-16 NOTE — Discharge Instructions (Signed)
 It is a pleasure taking care of you here in the emergency department  Your workup today was reassuring.  I started you on a few medications to help with your symptom.  Please be aware that Mobic is an anti-inflammatory medication do not take any other additional ibuprofen or Aleve or any other nonsteroidal anti-inflammatory medications while taking this medication as it may increase your risk for gastrointestinal bleeding.  Follow-up outpatient, return for new or worsening symptoms

## 2024-09-16 NOTE — ED Notes (Signed)
 Patient transported to Ultrasound

## 2024-09-16 NOTE — ED Notes (Signed)
 Patient ambulatory to restroom  ?

## 2024-10-12 ENCOUNTER — Other Ambulatory Visit: Payer: Self-pay

## 2024-10-12 DIAGNOSIS — R1031 Right lower quadrant pain: Secondary | ICD-10-CM | POA: Insufficient documentation

## 2024-10-12 DIAGNOSIS — R112 Nausea with vomiting, unspecified: Secondary | ICD-10-CM | POA: Diagnosis not present

## 2024-10-12 LAB — CBC
HCT: 39.6 % (ref 36.0–46.0)
Hemoglobin: 13.1 g/dL (ref 12.0–15.0)
MCH: 26.7 pg (ref 26.0–34.0)
MCHC: 33.1 g/dL (ref 30.0–36.0)
MCV: 80.8 fL (ref 80.0–100.0)
Platelets: 376 K/uL (ref 150–400)
RBC: 4.9 MIL/uL (ref 3.87–5.11)
RDW: 12.7 % (ref 11.5–15.5)
WBC: 10.4 K/uL (ref 4.0–10.5)
nRBC: 0 % (ref 0.0–0.2)

## 2024-10-12 NOTE — ED Triage Notes (Signed)
 First day on menstrual period. Normal flow. Intense pelvic pain- right worse. Lower back pain. Hx ovarian cysts, salpingectomy bilateral, endometrial ablation. Emesis x2 today.

## 2024-10-13 ENCOUNTER — Emergency Department (HOSPITAL_BASED_OUTPATIENT_CLINIC_OR_DEPARTMENT_OTHER)

## 2024-10-13 ENCOUNTER — Emergency Department (HOSPITAL_BASED_OUTPATIENT_CLINIC_OR_DEPARTMENT_OTHER)
Admission: EM | Admit: 2024-10-13 | Discharge: 2024-10-13 | Disposition: A | Attending: Emergency Medicine | Admitting: Emergency Medicine

## 2024-10-13 ENCOUNTER — Encounter (HOSPITAL_BASED_OUTPATIENT_CLINIC_OR_DEPARTMENT_OTHER): Payer: Self-pay

## 2024-10-13 DIAGNOSIS — B9689 Other specified bacterial agents as the cause of diseases classified elsewhere: Secondary | ICD-10-CM

## 2024-10-13 DIAGNOSIS — R1024 Suprapubic pain: Secondary | ICD-10-CM

## 2024-10-13 DIAGNOSIS — T148XXA Other injury of unspecified body region, initial encounter: Secondary | ICD-10-CM

## 2024-10-13 LAB — URINALYSIS, ROUTINE W REFLEX MICROSCOPIC
Bilirubin Urine: NEGATIVE
Cellular Cast, UA: 8
Glucose, UA: NEGATIVE mg/dL
Ketones, ur: 40 mg/dL — AB
Leukocytes,Ua: NEGATIVE
Nitrite: NEGATIVE
Specific Gravity, Urine: 1.019 (ref 1.005–1.030)
pH: 5.5 (ref 5.0–8.0)

## 2024-10-13 LAB — WET PREP, GENITAL
Sperm: NONE SEEN
Trich, Wet Prep: NONE SEEN
WBC, Wet Prep HPF POC: <10
Yeast Wet Prep HPF POC: NONE SEEN

## 2024-10-13 LAB — COMPREHENSIVE METABOLIC PANEL WITH GFR
ALT: 42 U/L (ref 0–44)
AST: 24 U/L (ref 15–41)
Albumin: 5 g/dL (ref 3.5–5.0)
Alkaline Phosphatase: 95 U/L (ref 38–126)
Anion gap: 14 (ref 5–15)
BUN: 6 mg/dL (ref 6–20)
CO2: 24 mmol/L (ref 22–32)
Calcium: 10.3 mg/dL (ref 8.9–10.3)
Chloride: 100 mmol/L (ref 98–111)
Creatinine, Ser: 0.7 mg/dL (ref 0.44–1.00)
GFR, Estimated: >60 mL/min
Glucose, Bld: 112 mg/dL — ABNORMAL HIGH (ref 70–99)
Potassium: 3.6 mmol/L (ref 3.5–5.1)
Sodium: 137 mmol/L (ref 135–145)
Total Bilirubin: 0.3 mg/dL (ref 0.0–1.2)
Total Protein: 8.6 g/dL — ABNORMAL HIGH (ref 6.5–8.1)

## 2024-10-13 LAB — PREGNANCY, URINE: Preg Test, Ur: NEGATIVE

## 2024-10-13 LAB — LIPASE, BLOOD: Lipase: 17 U/L (ref 11–51)

## 2024-10-13 MED ORDER — KETOROLAC TROMETHAMINE 30 MG/ML IJ SOLN
30.0000 mg | Freq: Once | INTRAMUSCULAR | Status: AC
  Administered 2024-10-13: 30 mg via INTRAVENOUS
  Filled 2024-10-13: qty 1

## 2024-10-13 MED ORDER — ONDANSETRON HCL 4 MG/2ML IJ SOLN
4.0000 mg | Freq: Once | INTRAMUSCULAR | Status: AC
  Administered 2024-10-13: 4 mg via INTRAVENOUS
  Filled 2024-10-13: qty 2

## 2024-10-13 MED ORDER — MORPHINE SULFATE (PF) 4 MG/ML IV SOLN
4.0000 mg | Freq: Once | INTRAVENOUS | Status: AC
  Administered 2024-10-13: 4 mg via INTRAVENOUS
  Filled 2024-10-13: qty 1

## 2024-10-13 MED ORDER — DICYCLOMINE HCL 10 MG PO CAPS
10.0000 mg | ORAL_CAPSULE | Freq: Once | ORAL | Status: AC
  Administered 2024-10-13: 10 mg via ORAL
  Filled 2024-10-13: qty 1

## 2024-10-13 MED ORDER — MORPHINE SULFATE (PF) 2 MG/ML IV SOLN
2.0000 mg | Freq: Once | INTRAVENOUS | Status: AC
  Administered 2024-10-13: 2 mg via INTRAVENOUS
  Filled 2024-10-13: qty 1

## 2024-10-13 MED ORDER — SODIUM CHLORIDE 0.9 % IV BOLUS
1000.0000 mL | Freq: Once | INTRAVENOUS | Status: AC
  Administered 2024-10-13: 1000 mL via INTRAVENOUS

## 2024-10-13 MED ORDER — METRONIDAZOLE 500 MG PO TABS: 500.0000 mg | ORAL_TABLET | Freq: Two times a day (BID) | ORAL | 0 refills | Status: AC

## 2024-10-13 NOTE — ED Provider Notes (Signed)
 " [place] EMERGENCY DEPARTMENT AT [facility] Provider Note   CSN: [id number] Arrival date & time: 10/12/24  2309     Patient presents with: Abdominal Pain   Linnet Bottari is a 33 y.o. female.   HPI    This is a 33 year old female who presents with lower abdominal pain.  Patient reports that this is the first day of her menstrual period.  She has had normal flow.  She has significant right lower quadrant and right flank pain that started all of a sudden.  States this feels different than menstrual cramps.  No fevers.  No urinary symptoms.  Reports nausea and vomiting.  Workup on 12/1 with CT imaging and pelvic ultrasound that were largely unremarkable.  Prior to Admission medications  Medication Sig Start Date End Date Taking? Authorizing Provider  dicyclomine  (BENTYL ) 20 MG tablet Take 1 tablet (20 mg total) by mouth 2 (two) times daily. 09/16/24   Henderly, Britni A, PA-C  hydrochlorothiazide (HYDRODIURIL) 12.5 MG tablet Take 12.5 mg by mouth every morning. 04/23/21   [provider]  hydrOXYzine  (ATARAX /VISTARIL ) 25 MG tablet Take 1 tablet (25 mg total) by mouth every 6 (six) hours. 06/17/20   Hall-Potvin, Brittany, PA-C  meloxicam  (MOBIC ) 7.5 MG tablet Take 1 tablet (7.5 mg total) by mouth daily. 09/16/24   Henderly, Britni A, PA-C  ondansetron  (ZOFRAN -ODT) 4 MG disintegrating tablet Take 1 tablet (4 mg total) by mouth every 8 (eight) hours as needed. 09/16/24   Henderly, Britni A, PA-C  sertraline (ZOLOFT) 50 MG tablet Take 50 mg by mouth daily. 04/28/21   [provider]    Allergies: Patient has no known allergies.    Review of Systems  Constitutional:  Negative for fever.  Respiratory:  Negative for shortness of breath.   Cardiovascular:  Negative for chest pain.  Genitourinary:  Positive for pelvic pain and vaginal bleeding. Negative for dysuria.  All other systems reviewed and are negative.   Updated Vital Signs BP 107/74   Pulse 85   Temp  98.1 F (36.7 C) (Oral)   Resp 18   Ht 1.524 m (5')   Wt 98 kg   LMP 09/05/2024   SpO2 98%   BMI 42.19 kg/m   Physical Exam Vitals and nursing note reviewed. Exam conducted with a chaperone present.  Constitutional:      Appearance: She is well-developed. She is not ill-appearing.  HENT:     Head: Normocephalic and atraumatic.  Eyes:     Pupils: Pupils are equal, round, and reactive to light.  Cardiovascular:     Rate and Rhythm: Normal rate and regular rhythm.     Heart sounds: Normal heart sounds.  Pulmonary:     Effort: Pulmonary effort is normal. No respiratory distress.     Breath sounds: No wheezing.  Abdominal:     General: Bowel sounds are normal.     Palpations: Abdomen is soft.     Tenderness: There is abdominal tenderness in the right lower quadrant. There is no guarding or rebound.  Genitourinary:    Vagina: Normal.     Comments: Scant blood noted in the vaginal vault, no cervical friability, no cervical motion tenderness, no adnexal tenderness Musculoskeletal:     Cervical back: Neck supple.  Skin:    General: Skin is warm and dry.  Neurological:     Mental Status: She is alert and oriented to person, place, and time.  Psychiatric:        Mood and  Affect: Mood normal.     (all labs ordered are listed, but only abnormal results are displayed) Labs Reviewed  WET PREP, GENITAL - Abnormal; Notable for the following components:      Result Value   Clue Cells Wet Prep HPF POC PRESENT (*)    All other components within normal limits  COMPREHENSIVE METABOLIC PANEL WITH GFR - Abnormal; Notable for the following components:   Glucose, Bld 112 (*)    Total Protein 8.6 (*)    All other components within normal limits  URINALYSIS, ROUTINE W REFLEX MICROSCOPIC - Abnormal; Notable for the following components:   Hgb urine dipstick LARGE (*)    Ketones, ur 40 (*)    Protein, ur TRACE (*)    Bacteria, UA RARE (*)    All other components within normal limits   LIPASE, BLOOD  CBC  PREGNANCY, URINE  GC/CHLAMYDIA PROBE AMP ([place]) NOT AT [facility]    EKG: None  Radiology: CT Renal Stone Study Result Date: 10/13/2024 EXAM: CT UROGRAM 10/13/2024 02:06:23 AM TECHNIQUE: CT of the abdomen and pelvis was performed without the administration of intravenous contrast as per CT urogram protocol. Multiplanar reformatted images as well as MIP urogram images are provided for review. Automated exposure control, iterative reconstruction, and/or weight based adjustment of the mA/kV was utilized to reduce the radiation dose to as low as reasonably achievable. COMPARISON: Prior examination of 09/16/2024. CLINICAL HISTORY: Abdominal/flank pain, stone suspected. FINDINGS: LOWER CHEST: No acute abnormality. LIVER: The liver is unremarkable. GALLBLADDER AND BILE DUCTS: Gallbladder is unremarkable. No biliary ductal dilatation. SPLEEN: No acute abnormality. PANCREAS: No acute abnormality. ADRENAL GLANDS: No acute abnormality. KIDNEYS, URETERS AND BLADDER: 2 mm nonobstructing calculus is seen within the interpolar region of the left kidney. No ureteral calculi. No hydronephrosis. No perinephric inflammatory stranding or fluid collections were identified. The bladder is unremarkable. GI AND BOWEL: Stomach demonstrates no acute abnormality. There is no bowel obstruction. PERITONEUM AND RETROPERITONEUM: No ascites. No free air. VASCULATURE: Aorta is normal in caliber. LYMPH NODES: No lymphadenopathy. REPRODUCTIVE ORGANS: There is a poorly visualized roughly 5.4 x 3.1 x 3.5 cm rounded fluid collection in the region of the endocervical canal demonstrating a fluid fluid level which may represent accumulated fluid secondary to an underlying cervical stenosis. This is not well characterized on this examination and will be better assessed with dedicated transvaginal pelvic sonography. This appears new from prior examination. BONES AND SOFT TISSUES: No acute osseous abnormality. No focal soft  tissue abnormality. IMPRESSION: 1. 2 mm nonobstructing calculus in the interpolar region of the left kidney. 2. Poorly visualized 5.4 x 3.1 x 3.5 cm rounded fluid collection in the region of the endocervical canal with a fluid-fluid level, possibly representing accumulated fluid secondary to underlying cervical stenosis, not well characterized on this examination; recommend dedicated transvaginal pelvic sonography for further characterization. Electronically signed by: Dorethia Molt MD 10/13/2024 02:42 AM EST RP Workstation: [id number]     Procedures   Medications Ordered in the ED  sodium chloride  0.9 % bolus 1,000 mL (0 mLs Intravenous Stopped 10/13/24 0404)  ondansetron  (ZOFRAN ) injection 4 mg (4 mg Intravenous Given 10/13/24 0127)  ketorolac  (TORADOL ) 30 MG/ML injection 30 mg (30 mg Intravenous Given 10/13/24 0128)  morphine  (PF) 4 MG/ML injection 4 mg (4 mg Intravenous Given 10/13/24 0145)    Clinical Course as of 10/13/24 0717  Sun Oct 13, 2024  0401 Patient clinically improved after pain medication.  Discussed with her CT findings.  Will hold for  ultrasound in the morning. [CH]  0700 Pelvic ultrasound for fluid collection in endometrium on CT, bit of wet prep [LS]    Clinical Course User Index [CH] Gerarda Conklin, Charmaine FALCON, MD [LS] Rogelia Jerilynn RAMAN, MD                                 Medical Decision Making Amount and/or Complexity of Data Reviewed Labs: ordered. Radiology: ordered.  Risk Prescription drug management.   This patient presents to the ED for concern of abdominal pain, this involves an extensive number of treatment options, and is a complaint that carries with it a high risk of complications and morbidity.  I considered the following differential and admission for this acute, potentially life threatening condition.  The differential diagnosis includes kidney stone, UTI, urine pathology, STD, appendicitis  MDM:    This is a 33 year old female who presents with  abdominal pain.  Associated with onset of period.  She is nontoxic and vital signs are reassuring.  She appears uncomfortable but nontoxic.  Has tenderness of right lower quadrant.  Labs obtained.  No significant leukocytosis.  Urinalysis is not consistent with UTI.  CMP reassuring.  Patient is not pregnant.  CT stone study does not show any evidence of kidney stone but does show a fluid collection in the pelvis.  Recommend ultrasound.  Patient reports she is not concerned about STDs.  Gonorrhea and Chlamydia testing pending.  (Labs, imaging, consults)  Labs: I Ordered, and personally interpreted labs.  The pertinent results include: CBC, CMP, lipase, urinalysis  Imaging Studies ordered: I ordered imaging studies including CT, ultrasound pending I independently visualized and interpreted imaging. I agree with the radiologist interpretation  Additional history obtained from chart review.  External records from outside source obtained and reviewed including prior evaluations  Cardiac Monitoring: The patient was maintained on a cardiac monitor.  If on the cardiac monitor, I personally viewed and interpreted the cardiac monitored which showed an underlying rhythm of: NS  Reevaluation: After the interventions noted above, I reevaluated the patient and found that they have :stayed the same  Social Determinants of Health:  lives independently  Disposition: Pending ultrasound, signed out to oncoming provider  Co morbidities that complicate the patient evaluation  Past Medical History:  Diagnosis Date   Depression      Medicines Meds ordered this encounter  Medications   sodium chloride  0.9 % bolus 1,000 mL   ondansetron  (ZOFRAN ) injection 4 mg   ketorolac  (TORADOL ) 30 MG/ML injection 30 mg   morphine  (PF) 4 MG/ML injection 4 mg    I have reviewed the patients home medicines and have made adjustments as needed  Problem List / ED Course: Problem List Items Addressed This Visit    None            Final diagnoses:  None    ED Discharge Orders     None          Bari Charmaine FALCON, MD 10/13/24 0719  "

## 2024-10-13 NOTE — Discharge Instructions (Addendum)
 Jamie Moses  Thank you for allowing us  to take care of you today.  You came to the Emergency Department today because you have had pain in your lower belly.  We got a CT of your abdomen that did not show any abnormalities of your organs in your abdomen, you did have some fluid in your uterus, therefore we got a CT which shows that you have some blood in your uterus, it could be that this is normal menstrual blood, per the radiology read, it is also possible that it is related to your ablation in the past, though this is less likely given it has been several years.  We recommend Zofran  and Bentyl  for pain control, as well as ibuprofen which you can use every 4-6 hours as needed.  You should follow-up closely with your OB/GYN.  If you have new or worsening symptoms, please come back to the emergency department for further evaluation.  You have bacterial vaginosis, this is an overgrowth of bacteria in your vagina that is not a sexually transmitted infection.  We are starting you on an antibiotic called Flagyl  that you will take twice a day for the next 7 days.  Please do not drink alcohol while you are on this medication as it will make you nauseous and vomit.  To-Do: 1. Please follow-up with your primary doctor within 1 - 2 weeks / as soon as possible.  Please return to the Emergency Department or call 911 if you experience have worsening of your symptoms, or do not get better, chest pain, shortness of breath, severe or significantly worsening pain, high fever, severe confusion, pass out or have any reason to think that you need emergency medical care.   We hope you feel better soon.   Mitzie Later, MD Department of Emergency Medicine [facility]

## 2024-10-13 NOTE — ED Provider Notes (Signed)
" °  [place] EMERGENCY DEPARTMENT AT [facility] Provider Assume Care Note I assumed care of Jamie Moses on 10/13/2024 at 7 AM from Dr. Cedric.   Briefly, Jamie Moses is a 33 y.o. female who: PMHx: Prior uterine ablation 3 to 4 years ago P/w lower abdominal pain, right flank pain Patient with endometrial fluid collection on CT, awaiting ultrasound which should arrive at 8 AM  Plan at the time of handoff: Follow-up ultrasound   Please refer to the original providers note for additional information regarding the care of Jamie Moses.  Reassessment: I personally reassessed the patient: Patient without any acute complaints or additional questions.  Vital Signs:  ED Triage Vitals  Encounter Vitals Group     BP 10/12/24 2321 (!) 142/100     Girls Systolic BP Percentile --      Girls Diastolic BP Percentile --      Boys Systolic BP Percentile --      Boys Diastolic BP Percentile --      Pulse Rate 10/12/24 2321 100     Resp 10/12/24 2321 16     Temp 10/12/24 2321 98.2 F (36.8 C)     Temp Source 10/12/24 2321 Oral     SpO2 10/12/24 2321 100 %     Weight 10/13/24 0713 216 lb 0.8 oz (98 kg)     Height 10/13/24 0713 5' (1.524 m)     Head Circumference --      Peak Flow --      Pain Score 10/12/24 2322 6     Pain Loc --      Pain Education --      Exclude from Growth Chart --      Hemodynamics:  The patient is hemodynamically stable. Mental Status:  The patient is alert  Additional MDM: Ultrasound demonstrates intrauterine hematoma, potentially post ablation per radiology read.  Per patient she has not had an ablation or other procedures on the uterus in 3 to 4 years, thus less likely related to ablation.  Patient otherwise with reassuring physical exam and workup, therefore do feel that she is stable for discharge and outpatient follow-up with her OB/GYN.  Recommended follow-up, additionally will prescribe for Flagyl  for bacterial vaginosis.  Disposition: DISCHARGE:  I believe that the patient is safe for discharge home with outpatient follow-up. Patient was informed of all pertinent physical exam, laboratory, and imaging findings. Patient's suspected etiology of their symptom presentation was discussed with the patient and all questions were answered. We discussed following up with PCP and OB/GYN. I provided thorough ED return precautions. The patient feels safe and comfortable with this plan.   FREDRIK CANDIE Later, MD Emergency Medicine    Later Jerilynn RAMAN, MD 10/13/24 1157  "

## 2024-10-14 LAB — GC/CHLAMYDIA PROBE AMP (~~LOC~~) NOT AT ARMC
Chlamydia: NEGATIVE
Comment: NEGATIVE
Comment: NORMAL
Neisseria Gonorrhea: NEGATIVE
